# Patient Record
Sex: Female | Born: 1935 | Race: White | Hispanic: No | Marital: Married | Smoking: Never smoker
Health system: Southern US, Community
[De-identification: ages and names within clinical notes are randomized; demographics above are authoritative.]

## PROBLEM LIST (undated history)

## (undated) DIAGNOSIS — R0600 Dyspnea, unspecified: Secondary | ICD-10-CM

## (undated) DIAGNOSIS — Z8739 Personal history of other diseases of the musculoskeletal system and connective tissue: Secondary | ICD-10-CM

## (undated) DIAGNOSIS — D649 Anemia, unspecified: Secondary | ICD-10-CM

## (undated) DIAGNOSIS — D692 Other nonthrombocytopenic purpura: Secondary | ICD-10-CM

## (undated) DIAGNOSIS — I10 Essential (primary) hypertension: Secondary | ICD-10-CM

## (undated) DIAGNOSIS — I471 Supraventricular tachycardia: Secondary | ICD-10-CM

## (undated) DIAGNOSIS — E7800 Pure hypercholesterolemia, unspecified: Secondary | ICD-10-CM

## (undated) DIAGNOSIS — R002 Palpitations: Secondary | ICD-10-CM

## (undated) DIAGNOSIS — R918 Other nonspecific abnormal finding of lung field: Secondary | ICD-10-CM

## (undated) DIAGNOSIS — J984 Other disorders of lung: Secondary | ICD-10-CM

## (undated) DIAGNOSIS — I341 Nonrheumatic mitral (valve) prolapse: Secondary | ICD-10-CM

## (undated) DIAGNOSIS — I493 Ventricular premature depolarization: Secondary | ICD-10-CM

## (undated) DIAGNOSIS — E785 Hyperlipidemia, unspecified: Secondary | ICD-10-CM

## (undated) DIAGNOSIS — R5381 Other malaise: Secondary | ICD-10-CM

## (undated) DIAGNOSIS — R5383 Other fatigue: Secondary | ICD-10-CM

## (undated) DIAGNOSIS — M949 Disorder of cartilage, unspecified: Secondary | ICD-10-CM

## (undated) DIAGNOSIS — M899 Disorder of bone, unspecified: Secondary | ICD-10-CM

## (undated) DIAGNOSIS — R21 Rash and other nonspecific skin eruption: Secondary | ICD-10-CM

## (undated) DIAGNOSIS — Z8679 Personal history of other diseases of the circulatory system: Secondary | ICD-10-CM

## (undated) DIAGNOSIS — IMO0002 Reserved for concepts with insufficient information to code with codable children: Secondary | ICD-10-CM

## (undated) DIAGNOSIS — L408 Other psoriasis: Secondary | ICD-10-CM

## (undated) HISTORY — DX: Dyspnea, unspecified: R06.00

## (undated) HISTORY — DX: Other nonspecific abnormal finding of lung field: R91.8

## (undated) HISTORY — DX: Personal history of other diseases of the circulatory system: Z86.79

## (undated) HISTORY — DX: Other psoriasis: L40.8

## (undated) HISTORY — DX: Disorder of bone, unspecified: M89.9

## (undated) HISTORY — DX: Ventricular premature depolarization: I49.3

## (undated) HISTORY — DX: Disorder of cartilage, unspecified: M94.9

## (undated) HISTORY — DX: Other malaise: R53.81

## (undated) HISTORY — PX: TONSILLECTOMY: SUR1361

## (undated) HISTORY — DX: Essential (primary) hypertension: I10

## (undated) HISTORY — DX: Anemia, unspecified: D64.9

## (undated) HISTORY — DX: Palpitations: R00.2

## (undated) HISTORY — DX: Supraventricular tachycardia: I47.1

## (undated) HISTORY — DX: Personal history of other diseases of the musculoskeletal system and connective tissue: Z87.39

## (undated) HISTORY — PX: BACK SURGERY: SHX140

## (undated) HISTORY — DX: Reserved for concepts with insufficient information to code with codable children: IMO0002

## (undated) HISTORY — DX: Rash and other nonspecific skin eruption: R21

## (undated) HISTORY — DX: Other disorders of lung: J98.4

## (undated) HISTORY — DX: Other nonthrombocytopenic purpura: D69.2

## (undated) HISTORY — DX: Other fatigue: R53.83

## (undated) HISTORY — DX: Hyperlipidemia, unspecified: E78.5

---

## 1997-11-13 ENCOUNTER — Other Ambulatory Visit: Admission: RE | Admit: 1997-11-13 | Discharge: 1997-11-13 | Payer: Self-pay | Admitting: Internal Medicine

## 1998-06-18 ENCOUNTER — Encounter: Payer: Self-pay | Admitting: Emergency Medicine

## 1998-06-18 ENCOUNTER — Emergency Department (HOSPITAL_COMMUNITY): Admission: EM | Admit: 1998-06-18 | Discharge: 1998-06-18 | Payer: Self-pay | Admitting: Emergency Medicine

## 2000-07-27 ENCOUNTER — Inpatient Hospital Stay (HOSPITAL_COMMUNITY): Admission: EM | Admit: 2000-07-27 | Discharge: 2000-07-28 | Payer: Self-pay | Admitting: Emergency Medicine

## 2000-07-27 ENCOUNTER — Encounter: Payer: Self-pay | Admitting: Internal Medicine

## 2000-07-28 ENCOUNTER — Encounter: Payer: Self-pay | Admitting: Internal Medicine

## 2000-10-29 ENCOUNTER — Ambulatory Visit (HOSPITAL_COMMUNITY): Admission: RE | Admit: 2000-10-29 | Discharge: 2000-10-29 | Payer: Self-pay | Admitting: Internal Medicine

## 2000-10-29 ENCOUNTER — Encounter: Payer: Self-pay | Admitting: Internal Medicine

## 2001-01-28 ENCOUNTER — Other Ambulatory Visit: Admission: RE | Admit: 2001-01-28 | Discharge: 2001-01-28 | Payer: Self-pay | Admitting: Obstetrics and Gynecology

## 2001-09-30 ENCOUNTER — Encounter: Payer: Self-pay | Admitting: Internal Medicine

## 2001-09-30 ENCOUNTER — Ambulatory Visit (HOSPITAL_COMMUNITY): Admission: RE | Admit: 2001-09-30 | Discharge: 2001-09-30 | Payer: Self-pay | Admitting: Internal Medicine

## 2002-02-20 ENCOUNTER — Encounter: Payer: Self-pay | Admitting: Internal Medicine

## 2003-10-30 ENCOUNTER — Encounter: Payer: Self-pay | Admitting: Internal Medicine

## 2003-10-30 ENCOUNTER — Encounter: Admission: RE | Admit: 2003-10-30 | Discharge: 2003-10-30 | Payer: Self-pay | Admitting: Internal Medicine

## 2004-07-15 ENCOUNTER — Ambulatory Visit: Payer: Self-pay | Admitting: Internal Medicine

## 2004-10-14 ENCOUNTER — Ambulatory Visit: Payer: Self-pay | Admitting: Internal Medicine

## 2004-10-20 ENCOUNTER — Ambulatory Visit: Payer: Self-pay | Admitting: Internal Medicine

## 2004-12-27 ENCOUNTER — Encounter: Payer: Self-pay | Admitting: Internal Medicine

## 2005-01-23 ENCOUNTER — Ambulatory Visit: Payer: Self-pay | Admitting: Internal Medicine

## 2005-10-26 ENCOUNTER — Ambulatory Visit: Payer: Self-pay | Admitting: Internal Medicine

## 2005-10-27 ENCOUNTER — Ambulatory Visit: Payer: Self-pay | Admitting: Internal Medicine

## 2006-09-04 ENCOUNTER — Ambulatory Visit: Payer: Self-pay | Admitting: Internal Medicine

## 2006-09-04 LAB — CONVERTED CEMR LAB
Bilirubin Urine: NEGATIVE
Specific Gravity, Urine: <1.005
Urobilinogen, UA: NEGATIVE

## 2006-09-07 ENCOUNTER — Encounter (INDEPENDENT_AMBULATORY_CARE_PROVIDER_SITE_OTHER): Payer: Self-pay | Admitting: *Deleted

## 2006-09-17 ENCOUNTER — Encounter: Payer: Self-pay | Admitting: Internal Medicine

## 2006-10-16 ENCOUNTER — Encounter: Payer: Self-pay | Admitting: Internal Medicine

## 2006-10-25 ENCOUNTER — Ambulatory Visit: Payer: Self-pay | Admitting: Internal Medicine

## 2006-10-25 ENCOUNTER — Encounter: Payer: Self-pay | Admitting: Internal Medicine

## 2006-10-25 DIAGNOSIS — I10 Essential (primary) hypertension: Secondary | ICD-10-CM

## 2006-10-25 DIAGNOSIS — L408 Other psoriasis: Secondary | ICD-10-CM | POA: Insufficient documentation

## 2006-10-25 DIAGNOSIS — J984 Other disorders of lung: Secondary | ICD-10-CM

## 2006-10-25 DIAGNOSIS — E785 Hyperlipidemia, unspecified: Secondary | ICD-10-CM

## 2006-10-25 DIAGNOSIS — Z8679 Personal history of other diseases of the circulatory system: Secondary | ICD-10-CM

## 2006-10-25 DIAGNOSIS — Z8739 Personal history of other diseases of the musculoskeletal system and connective tissue: Secondary | ICD-10-CM

## 2006-10-25 DIAGNOSIS — IMO0002 Reserved for concepts with insufficient information to code with codable children: Secondary | ICD-10-CM

## 2006-10-25 HISTORY — DX: Personal history of other diseases of the circulatory system: Z86.79

## 2006-10-25 HISTORY — DX: Reserved for concepts with insufficient information to code with codable children: IMO0002

## 2006-10-25 HISTORY — DX: Other disorders of lung: J98.4

## 2006-10-25 HISTORY — DX: Personal history of other diseases of the musculoskeletal system and connective tissue: Z87.39

## 2006-10-25 HISTORY — DX: Hyperlipidemia, unspecified: E78.5

## 2006-10-25 HISTORY — DX: Other psoriasis: L40.8

## 2006-10-25 HISTORY — DX: Essential (primary) hypertension: I10

## 2006-10-25 LAB — CONVERTED CEMR LAB: Cholesterol, target level: 200 mg/dL

## 2006-10-30 ENCOUNTER — Encounter (INDEPENDENT_AMBULATORY_CARE_PROVIDER_SITE_OTHER): Payer: Self-pay | Admitting: *Deleted

## 2006-10-30 ENCOUNTER — Encounter: Payer: Self-pay | Admitting: Internal Medicine

## 2006-10-30 LAB — CONVERTED CEMR LAB
ALT: 11 units/L (ref 0–35)
AST: 19 units/L (ref 0–37)
BUN: 18 mg/dL (ref 6–23)
Basophils Relative: 1.8 % — ABNORMAL HIGH (ref 0.0–1.0)
Creatinine, Ser: 1.1 mg/dL (ref 0.4–1.2)
HDL: 95.5 mg/dL (ref >39.0)
Hemoglobin: 12.7 g/dL (ref 12.0–15.0)
LDL Cholesterol: 73 mg/dL (ref 0–99)
Monocytes Relative: 7.5 % (ref 3.0–11.0)
Platelets: 208 10*3/uL (ref 150–400)
Potassium: 4 meq/L (ref 3.5–5.1)
RDW: 12.7 % (ref 11.5–14.6)
Triglycerides: 123 mg/dL (ref 0–149)
VLDL: 25 mg/dL (ref 0–40)

## 2006-11-02 ENCOUNTER — Telehealth (INDEPENDENT_AMBULATORY_CARE_PROVIDER_SITE_OTHER): Payer: Self-pay | Admitting: *Deleted

## 2007-01-22 ENCOUNTER — Ambulatory Visit: Payer: Self-pay | Admitting: Internal Medicine

## 2007-01-23 ENCOUNTER — Encounter: Payer: Self-pay | Admitting: Internal Medicine

## 2007-01-28 ENCOUNTER — Telehealth: Payer: Self-pay | Admitting: Internal Medicine

## 2007-01-29 ENCOUNTER — Encounter (INDEPENDENT_AMBULATORY_CARE_PROVIDER_SITE_OTHER): Payer: Self-pay | Admitting: *Deleted

## 2007-10-14 ENCOUNTER — Telehealth (INDEPENDENT_AMBULATORY_CARE_PROVIDER_SITE_OTHER): Payer: Self-pay | Admitting: *Deleted

## 2007-11-06 ENCOUNTER — Ambulatory Visit: Payer: Self-pay | Admitting: Internal Medicine

## 2007-11-06 DIAGNOSIS — R002 Palpitations: Secondary | ICD-10-CM

## 2007-11-06 DIAGNOSIS — M949 Disorder of cartilage, unspecified: Secondary | ICD-10-CM

## 2007-11-06 DIAGNOSIS — M899 Disorder of bone, unspecified: Secondary | ICD-10-CM

## 2007-11-06 DIAGNOSIS — D692 Other nonthrombocytopenic purpura: Secondary | ICD-10-CM

## 2007-11-06 HISTORY — DX: Other nonthrombocytopenic purpura: D69.2

## 2007-11-06 HISTORY — DX: Disorder of bone, unspecified: M89.9

## 2007-11-06 HISTORY — DX: Palpitations: R00.2

## 2007-11-11 ENCOUNTER — Encounter (INDEPENDENT_AMBULATORY_CARE_PROVIDER_SITE_OTHER): Payer: Self-pay | Admitting: *Deleted

## 2007-12-03 ENCOUNTER — Ambulatory Visit: Payer: Self-pay | Admitting: Internal Medicine

## 2007-12-04 ENCOUNTER — Encounter (INDEPENDENT_AMBULATORY_CARE_PROVIDER_SITE_OTHER): Payer: Self-pay | Admitting: *Deleted

## 2008-01-01 ENCOUNTER — Encounter (INDEPENDENT_AMBULATORY_CARE_PROVIDER_SITE_OTHER): Payer: Self-pay | Admitting: *Deleted

## 2008-01-24 ENCOUNTER — Encounter: Payer: Self-pay | Admitting: Internal Medicine

## 2008-01-30 ENCOUNTER — Telehealth (INDEPENDENT_AMBULATORY_CARE_PROVIDER_SITE_OTHER): Payer: Self-pay | Admitting: *Deleted

## 2008-03-17 ENCOUNTER — Telehealth (INDEPENDENT_AMBULATORY_CARE_PROVIDER_SITE_OTHER): Payer: Self-pay | Admitting: *Deleted

## 2008-05-11 ENCOUNTER — Ambulatory Visit: Payer: Self-pay | Admitting: Internal Medicine

## 2008-05-24 LAB — CONVERTED CEMR LAB: BUN: 18 mg/dL (ref 6–23)

## 2008-05-25 ENCOUNTER — Encounter (INDEPENDENT_AMBULATORY_CARE_PROVIDER_SITE_OTHER): Payer: Self-pay | Admitting: *Deleted

## 2008-06-15 ENCOUNTER — Ambulatory Visit: Payer: Self-pay | Admitting: Internal Medicine

## 2008-11-06 ENCOUNTER — Ambulatory Visit: Payer: Self-pay | Admitting: Internal Medicine

## 2008-11-06 DIAGNOSIS — R5381 Other malaise: Secondary | ICD-10-CM

## 2008-11-06 DIAGNOSIS — R5383 Other fatigue: Secondary | ICD-10-CM

## 2008-11-06 HISTORY — DX: Other malaise: R53.81

## 2008-11-16 ENCOUNTER — Encounter: Payer: Self-pay | Admitting: Internal Medicine

## 2008-11-17 ENCOUNTER — Telehealth (INDEPENDENT_AMBULATORY_CARE_PROVIDER_SITE_OTHER): Payer: Self-pay | Admitting: *Deleted

## 2008-12-18 ENCOUNTER — Encounter: Payer: Self-pay | Admitting: Internal Medicine

## 2008-12-30 ENCOUNTER — Encounter: Payer: Self-pay | Admitting: Internal Medicine

## 2009-01-11 ENCOUNTER — Encounter: Payer: Self-pay | Admitting: Internal Medicine

## 2009-06-23 ENCOUNTER — Encounter: Payer: Self-pay | Admitting: Internal Medicine

## 2009-07-08 ENCOUNTER — Encounter: Payer: Self-pay | Admitting: Internal Medicine

## 2009-08-10 ENCOUNTER — Encounter: Payer: Self-pay | Admitting: Internal Medicine

## 2009-11-09 ENCOUNTER — Ambulatory Visit: Payer: Self-pay | Admitting: Internal Medicine

## 2009-11-09 DIAGNOSIS — R21 Rash and other nonspecific skin eruption: Secondary | ICD-10-CM

## 2009-11-09 HISTORY — DX: Rash and other nonspecific skin eruption: R21

## 2009-11-17 ENCOUNTER — Encounter (INDEPENDENT_AMBULATORY_CARE_PROVIDER_SITE_OTHER): Payer: Self-pay | Admitting: *Deleted

## 2009-11-17 ENCOUNTER — Ambulatory Visit: Payer: Self-pay | Admitting: Internal Medicine

## 2009-11-17 LAB — CONVERTED CEMR LAB: OCCULT 1: NEGATIVE

## 2009-11-23 ENCOUNTER — Telehealth (INDEPENDENT_AMBULATORY_CARE_PROVIDER_SITE_OTHER): Payer: Self-pay | Admitting: *Deleted

## 2010-04-10 ENCOUNTER — Encounter: Payer: Self-pay | Admitting: Internal Medicine

## 2010-04-17 LAB — CONVERTED CEMR LAB
ALT: 10 units/L (ref 0–35)
ALT: 10 units/L (ref 0–35)
AST: 23 units/L (ref 0–37)
Albumin: 4.1 g/dL (ref 3.5–5.2)
Albumin: 4.2 g/dL (ref 3.5–5.2)
Alkaline Phosphatase: 57 units/L (ref 39–117)
BUN: 24 mg/dL — ABNORMAL HIGH (ref 6–23)
Basophils Absolute: 0 10*3/uL (ref 0.0–0.1)
Basophils Relative: 0.1 % (ref 0.0–3.0)
Basophils Relative: 0.2 % (ref 0.0–3.0)
Basophils Relative: 0.7 % (ref 0.0–3.0)
Bilirubin, Direct: 0.1 mg/dL (ref 0.0–0.3)
CO2: 30 meq/L (ref 19–32)
Calcium: 9.2 mg/dL (ref 8.4–10.5)
Chloride: 108 meq/L (ref 96–112)
Chloride: 110 meq/L (ref 96–112)
Direct LDL: 80.3 mg/dL
Eosinophils Absolute: 0.2 10*3/uL (ref 0.0–0.7)
Eosinophils Absolute: 0.3 10*3/uL (ref 0.0–0.7)
Eosinophils Relative: 3.1 % (ref 0.0–5.0)
Eosinophils Relative: 5.9 % — ABNORMAL HIGH (ref 0.0–5.0)
HCT: 34.2 % — ABNORMAL LOW (ref 36.0–46.0)
HCT: 38 % (ref 36.0–46.0)
Hemoglobin: 13.1 g/dL (ref 12.0–15.0)
Hemoglobin: 13.7 g/dL (ref 12.0–15.0)
LDL Cholesterol: 58 mg/dL (ref 0–99)
Lymphocytes Relative: 30.3 % (ref 12.0–46.0)
Lymphs Abs: 1.6 10*3/uL (ref 0.7–4.0)
Lymphs Abs: 1.7 10*3/uL (ref 0.7–4.0)
MCHC: 34.2 g/dL (ref 30.0–36.0)
MCHC: 34.2 g/dL (ref 30.0–36.0)
MCV: 96.1 fL (ref 78.0–100.0)
MCV: 98.3 fL (ref 78.0–100.0)
MCV: 98.5 fL (ref 78.0–100.0)
Monocytes Absolute: 0.4 10*3/uL (ref 0.1–1.0)
Monocytes Absolute: 0.4 10*3/uL (ref 0.1–1.0)
Neutro Abs: 2.8 10*3/uL (ref 1.4–7.7)
Neutro Abs: 3.7 10*3/uL (ref 1.4–7.7)
Neutrophils Relative %: 43.5 % (ref 43.0–77.0)
Platelets: 176 10*3/uL (ref 150.0–400.0)
Potassium: 3.5 meq/L (ref 3.5–5.1)
Potassium: 3.7 meq/L (ref 3.5–5.1)
RBC: 3.56 M/uL — ABNORMAL LOW (ref 3.87–5.11)
RBC: 3.86 M/uL — ABNORMAL LOW (ref 3.87–5.11)
RBC: 4.06 M/uL (ref 3.87–5.11)
Sodium: 145 meq/L (ref 135–145)
Sodium: 146 meq/L — ABNORMAL HIGH (ref 135–145)
TSH: 2.73 microintl units/mL (ref 0.35–5.50)
Total Bilirubin: 0.7 mg/dL (ref 0.3–1.2)
Total CHOL/HDL Ratio: 2
Total CK: 52 units/L (ref 7–177)
Total Protein: 6.1 g/dL (ref 6.0–8.3)
Total Protein: 7.1 g/dL (ref 6.0–8.3)
WBC: 5 10*3/uL (ref 4.5–10.5)

## 2010-04-19 NOTE — Letter (Signed)
Summary: [facility]   Imported By: Lanelle Bal 08/26/2009 14:14:35  _____________________________________________________________________  External Attachment:    Type:   Image     Comment:   External Document

## 2010-04-19 NOTE — Progress Notes (Signed)
Summary: SIMVASTATIN --90 DAY REFILL  Phone Note Refill Request Message from:  Fax from Pharmacy  Refills Requested: Medication #1:  ZOCOR 40 MG  TABS 1 by mouth qd [facility], [address] [place], [place]     [place] [phone]  PHONE [phone]  Initial call taken by: Jerolyn Shin,  November 23, 2009 1:04 PM    Prescriptions: ZOCOR 40 MG  TABS (SIMVASTATIN) 1 by mouth qd  #90 x 3   Entered by:   Shonna Chock CMA   Authorized by:   Marga Melnick MD   Signed by:   Shonna Chock CMA on 11/23/2009   Method used:   Electronically to        [facility] (retail)       [address]       [place], [place]  [address]       Ph: [phone] or [phone]       Fax: [phone]   RxID:   [id number]

## 2010-04-19 NOTE — Letter (Signed)
Summary: Results Follow up Letter  [facility] [place]  [address] [place], [place] [address]   Phone: [phone]  Fax: [phone]    11/17/2009 MRN: [id number]  Cheyenne Robinson [address] [place], [place]  [address]  Dear Ms. Grabill,  The following are the results of your recent test(s):  Test         Result    Pap Smear:        Normal _____  Not Normal _____ Comments: ______________________________________________________ Cholesterol: LDL(Bad cholesterol):         Your goal is less than:         HDL (Good cholesterol):       Your goal is more than: Comments:  ______________________________________________________ Mammogram:        Normal _____  Not Normal _____ Comments:  ___________________________________________________________________ Hemoccult:        Normal __X___  Not normal _______ Comments:    _____________________________________________________________________ Other Tests:    We routinely do not discuss normal results over the telephone.  If you desire a copy of the results, or you have any questions about this information we can discuss them at your next office visit.   Sincerely,

## 2010-04-19 NOTE — Progress Notes (Signed)
Summary: hop-refill  Phone Note Refill Request   Refills Requested: Medication #1:  simvastatin 40 mg [facility]--[phone]  Initial call taken by: Freddy Jaksch,  January 30, 2008 10:27 AM      Prescriptions: ZOCOR 40 MG  TABS (SIMVASTATIN) 1 by mouth qd  #90 x 1   Entered by:   Ardyth Man   Authorized by:   Marga Melnick MD   Signed by:   Ardyth Man on 01/30/2008   Method used:   Electronically to        [facility] (retail)       [address]       [place], [place]  [address]       Ph: [phone] or [phone]       Fax: [phone]   RxID:   [id number]
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
## 2010-08-05 NOTE — Discharge Summary (Signed)
[place]  Patient:    Cheyenne Robinson, Cheyenne Robinson                     MRN: [id number] Adm. Date:  [id number] Disc. Date: [id number] Attending:  Dolores Patty                           Discharge Summary  ADMISSION DIAGNOSES: 1. Chest pain in the setting of mitral valve prolapse. 2. Hypertension, suboptimally controlled. 3. Exertional dyspnea. 4. ______ therapy for osteoporosis. 5. Hyperlipidemia for which she is taking Nystatin and Lescol.  DISCHARGE DIAGNOSES: 1. Chest pain, resolved. 2. Cardiac enzymes negative with negative spiral CT and venous Doppler.  BRIEF HISTORY:  [name] is an extremely nice, 77 year old white female with no mitral valve prolapse.  She has had two to three weeks of intermittent chest pain.  Please see a copy of the history and physical on May 10, for full details.  HOSPITAL COURSE:  She was monitored on telemetry and cardiac enzymes were collected.  EKG revealed nonspecific ST T wave abnormality; digoxin effect was suggested, although, she was not on digitalis.  Venous Doppler was negative for deep venous thrombosis.  Portable chest x-ray showed no active disease. Spiral CT revealed no evidence of pulmonary embolus.  There was a nonspecific 1.2 x 0.9 cm right lower lobe nodule.  She also had some mild atelectasis and scarring bibasilarly with cardiac enlargement.  A lipid profile was performed which revealed a cholesterol of 223 with triglycerides of 98, HDL 104, and LDL of 99.  Her TSH was therapeutic and free T4 and free T3 were also normal.  She was asymptomatic on May 11.  Because of the negative study, she was discharged.  She was discharged on her Cardizem 300 mg daily, but hydrochlorothiazide 25 mg 1/2 daily was added because of the suboptimally controlled blood pressure.  Additionally, she was discharged on Protonix as hiatal hernia reflux was a possible explanation for her chest pain.  Obviously, her  mitral valve prolapse may be an etiologic component as well, although, she denies palpitations at this time.  She will be reevaluated in three to four weeks.  GI evaluation may be necessary if her symptoms persist. DD:  08/18/00 TD:  08/19/00 Job: [id number]

## 2011-10-26 ENCOUNTER — Encounter: Payer: Self-pay | Admitting: Cardiovascular Disease

## 2011-12-18 ENCOUNTER — Other Ambulatory Visit: Payer: Self-pay

## 2011-12-18 ENCOUNTER — Encounter (HOSPITAL_COMMUNITY): Payer: Self-pay | Admitting: Family Medicine

## 2011-12-18 ENCOUNTER — Emergency Department (HOSPITAL_COMMUNITY)
Admission: EM | Admit: 2011-12-18 | Discharge: 2011-12-18 | Disposition: A | Discharge status: Home / Self Care | Payer: Medicare Other | Attending: Emergency Medicine | Admitting: Emergency Medicine

## 2011-12-18 DIAGNOSIS — I059 Rheumatic mitral valve disease, unspecified: Secondary | ICD-10-CM | POA: Insufficient documentation

## 2011-12-18 DIAGNOSIS — R002 Palpitations: Secondary | ICD-10-CM

## 2011-12-18 DIAGNOSIS — E7800 Pure hypercholesterolemia, unspecified: Secondary | ICD-10-CM | POA: Insufficient documentation

## 2011-12-18 HISTORY — DX: Pure hypercholesterolemia, unspecified: E78.00

## 2011-12-18 HISTORY — DX: Nonrheumatic mitral (valve) prolapse: I34.1

## 2011-12-18 LAB — COMPREHENSIVE METABOLIC PANEL
ALT: 15 U/L (ref 0–35)
AST: 23 U/L (ref 0–37)
Albumin: 3.8 g/dL (ref 3.5–5.2)
Alkaline Phosphatase: 64 U/L (ref 39–117)
GFR calc Af Amer: 60 mL/min — ABNORMAL LOW (ref >90)
Glucose, Bld: 108 mg/dL — ABNORMAL HIGH (ref 70–99)
Potassium: 3.8 mEq/L (ref 3.5–5.1)
Sodium: 143 mEq/L (ref 135–145)
Total Protein: 6.5 g/dL (ref 6.0–8.3)

## 2011-12-18 LAB — CBC WITH DIFFERENTIAL/PLATELET
Eosinophils Absolute: 0 10*3/uL (ref 0.0–0.7)
Lymphs Abs: 1.5 10*3/uL (ref 0.7–4.0)
MCH: 32.7 pg (ref 26.0–34.0)
Neutrophils Relative %: 58 % (ref 43–77)
Platelets: 157 10*3/uL (ref 150–400)
RBC: 3.94 MIL/uL (ref 3.87–5.11)
WBC: 4.7 10*3/uL (ref 4.0–10.5)

## 2011-12-18 MED ORDER — METOPROLOL SUCCINATE ER 25 MG PO TB24: 25.0000 mg | ORAL_TABLET | Freq: Every day | ORAL | Status: DC

## 2011-12-18 NOTE — ED Provider Notes (Signed)
History     CSN: [id number]  Arrival date & time 12/18/11  1478   First MD Initiated Contact with Patient 12/18/11 0900      Chief Complaint  Patient presents with  . Tachycardia    (Consider location/radiation/quality/duration/timing/severity/associated sxs/prior treatment) HPI Comments: Patient reports having recurrent episodes of palpitations for the past several months.  She has been seen by her pcp for the same and is due to see Dr. Sanjuana Kava on the 10th of October.  She has had a Holter monitor done but she is unsure of the results.  She also had an echocardiogram for which she has the report.  This shows an EF of 55% with mitral regurgitation.    She denies any chest pain but does feel short of breath.  She woke last night with an episode of palpitations that was worse than most others.  Feels tired today.  Patient is a 78 y.o. female presenting with palpitations. The history is provided by the patient.  Palpitations  This is a recurrent problem. Episode onset: last night. The problem occurs daily. The problem has been gradually worsening. Associated with: nothing  Associated symptoms include irregular heartbeat and shortness of breath. Pertinent negatives include no diaphoresis, no fever and no chest pain.    Past Medical History  Diagnosis Date  . Mitral valve prolapse   . High cholesterol     History reviewed. No pertinent past surgical history.  History reviewed. No pertinent family history.  History  Substance Use Topics  . Smoking status: Never Smoker   . Smokeless tobacco: Not on file  . Alcohol Use: No    OB History    Grav Para Term Preterm Abortions TAB SAB Ect Mult Living                  Review of Systems  Constitutional: Negative for fever and diaphoresis.  Respiratory: Positive for shortness of breath.   Cardiovascular: Positive for palpitations. Negative for chest pain and leg swelling.  All other systems reviewed and are  negative.    Allergies  Moxifloxacin and Sulfonamide derivatives  Home Medications   Current Outpatient Rx  Name Route Sig Dispense Refill  . ASPIRIN 81 MG PO TABS Oral Take 81 mg by mouth daily.    Marland Kitchen CALCIUM 1200 PO Oral Take 1 tablet by mouth daily.    Marland Kitchen VITAMIN D 1000 UNITS PO TABS Oral Take 1,000 Units by mouth daily.    Marland Kitchen DILTIAZEM HCL ER COATED BEADS 120 MG PO CP24 Oral Take 120 mg by mouth daily.    Marland Kitchen LOVASTATIN 40 MG PO TABS Oral Take 40 mg by mouth at bedtime.    Marland Kitchen VITAMIN B-12 1000 MCG PO TABS Oral Take 1,000 mcg by mouth daily.      BP 162/93  Pulse 84  Temp 97.4 F (36.3 C)  Resp 18  SpO2 97%  Physical Exam  Nursing note and vitals reviewed. Constitutional: She is oriented to person, place, and time. She appears well-developed and well-nourished. No distress.  HENT:  Head: Normocephalic and atraumatic.  Neck: Normal range of motion. Neck supple.  Cardiovascular: Normal rate and regular rhythm.  Exam reveals no gallop and no friction rub.   No murmur heard. Pulmonary/Chest: Effort normal and breath sounds normal. No respiratory distress. She has no wheezes.  Abdominal: Soft. Bowel sounds are normal. She exhibits no distension. There is no tenderness.  Musculoskeletal: Normal range of motion.  Neurological: She is alert and oriented  to person, place, and time.  Skin: Skin is warm and dry. She is not diaphoretic.    ED Course  Procedures (including critical care time)   Labs Reviewed  CBC WITH DIFFERENTIAL  COMPREHENSIVE METABOLIC PANEL  TROPONIN I  TSH   No results found.   No diagnosis found.   Date: 12/18/2011  Rate: 81  Rhythm: normal sinus rhythm  QRS Axis: normal  Intervals: normal  ST/T Wave abnormalities: normal  Conduction Disutrbances:none  Narrative Interpretation:   Old EKG Reviewed: unchanged     MDM  The patient presents with palpitations for the past several months.  Her recent echo showed mr, but normal ef.  A recent  Holter showed frequent and runs of PAC's.  The labs look unremarkable today with a tsh pending.  I have spoken with Dr. Tenny Craw who is on call today for [facility].  She believes that adding a low dose of metoprolol XL to her medications would be appropriate.  I will prescribe this.  She has an appointment with Dr. Sanjuana Kava on the 10th of October.  To return prn if she worsens.        Geoffery Lyons, MD 12/18/11 1037

## 2011-12-18 NOTE — ED Notes (Signed)
Per pt she has been having intermittent episodes of rapid heart beat since last night associated with SOB and dizziness. Denies it now. sts she is weak.

## 2011-12-24 ENCOUNTER — Emergency Department (HOSPITAL_COMMUNITY): Payer: Medicare Other

## 2011-12-24 ENCOUNTER — Encounter (HOSPITAL_COMMUNITY): Payer: Self-pay | Admitting: Emergency Medicine

## 2011-12-24 ENCOUNTER — Emergency Department (HOSPITAL_COMMUNITY)
Admission: EM | Admit: 2011-12-24 | Discharge: 2011-12-24 | Disposition: A | Discharge status: Home / Self Care | Payer: Medicare Other | Attending: Emergency Medicine | Admitting: Emergency Medicine

## 2011-12-24 DIAGNOSIS — E789 Disorder of lipoprotein metabolism, unspecified: Secondary | ICD-10-CM | POA: Insufficient documentation

## 2011-12-24 DIAGNOSIS — Z7982 Long term (current) use of aspirin: Secondary | ICD-10-CM | POA: Insufficient documentation

## 2011-12-24 DIAGNOSIS — R0789 Other chest pain: Secondary | ICD-10-CM | POA: Insufficient documentation

## 2011-12-24 DIAGNOSIS — Z79899 Other long term (current) drug therapy: Secondary | ICD-10-CM | POA: Insufficient documentation

## 2011-12-24 LAB — BASIC METABOLIC PANEL
BUN: 15 mg/dL (ref 6–23)
CO2: 26 mEq/L (ref 19–32)
Calcium: 9.8 mg/dL (ref 8.4–10.5)
Creatinine, Ser: 1.09 mg/dL (ref 0.50–1.10)
Glucose, Bld: 99 mg/dL (ref 70–99)

## 2011-12-24 LAB — CBC
HCT: 36.3 % (ref 36.0–46.0)
Hemoglobin: 12.3 g/dL (ref 12.0–15.0)
MCH: 32.7 pg (ref 26.0–34.0)
MCV: 96.5 fL (ref 78.0–100.0)
RBC: 3.76 MIL/uL — ABNORMAL LOW (ref 3.87–5.11)

## 2011-12-24 NOTE — ED Notes (Addendum)
(  Denies: pain, sob or nausea at this time), describes pain as "comes and goes", alert, NAD, calm, interactive, speaking in clear complete sentences, resps e/u, "ready to go, just want to go home, agreeable to stay", lab and xray results reviewed, discussed with EDP, waiting on EDP eval, pending orders. Family at [facility] x2.

## 2011-12-24 NOTE — ED Notes (Signed)
Pt c/o left sided CP starting this am; pt sts some SOB but denies nausea; pt sts seen here last Monday for same

## 2011-12-25 NOTE — ED Provider Notes (Signed)
History     CSN: [id number]  Arrival date & time 12/24/11  1634   First MD Initiated Contact with Patient 12/24/11 1956      Chief Complaint  Patient presents with  . Chest Pain    (Consider location/radiation/quality/duration/timing/severity/associated sxs/prior treatment) HPI Comments: The patient presents here complaining of sharp pains in the chest that started this morning.  She has had several episodes of this today.  These sharp pains last for only a split second, then go away.  There are no aggravating or alleviating factors.  There is no exertional component.  She was seen one week ago by me for palpitations and was started on a beta blocker.  This has helped this.  She has an appointment with cardiology on the 10th of October.    Patient is a 78 y.o. female presenting with chest pain. The history is provided by the patient.  Chest Pain Episode onset: this morning. Chest pain occurs intermittently. The chest pain is resolved. Associated with: nothing. The severity of the pain is mild. The quality of the pain is described as sharp. The pain does not radiate. Exacerbated by: nothing. Pertinent negatives for primary symptoms include no cough and no nausea. She tried nothing for the symptoms. Risk factors include no known risk factors.     Past Medical History  Diagnosis Date  . Mitral valve prolapse   . High cholesterol     History reviewed. No pertinent past surgical history.  History reviewed. No pertinent family history.  History  Substance Use Topics  . Smoking status: Never Smoker   . Smokeless tobacco: Not on file  . Alcohol Use: No    OB History    Grav Para Term Preterm Abortions TAB SAB Ect Mult Living                  Review of Systems  Respiratory: Negative for cough.   Cardiovascular: Positive for chest pain.  Gastrointestinal: Negative for nausea.  All other systems reviewed and are negative.    Allergies  Moxifloxacin and Sulfonamide  derivatives  Home Medications   Current Outpatient Rx  Name Route Sig Dispense Refill  . ASPIRIN 81 MG PO TABS Oral Take 81 mg by mouth daily.    Marland Kitchen CALCIUM 1200 PO Oral Take 1 tablet by mouth daily.    Marland Kitchen VITAMIN D 1000 UNITS PO TABS Oral Take 1,000 Units by mouth daily.    Marland Kitchen DILTIAZEM HCL ER COATED BEADS 120 MG PO CP24 Oral Take 120 mg by mouth daily.    Marland Kitchen LOVASTATIN 40 MG PO TABS Oral Take 40 mg by mouth at bedtime.    Marland Kitchen METOPROLOL SUCCINATE ER 25 MG PO TB24 Oral Take 1 tablet (25 mg total) by mouth daily. 15 tablet 0  . VITAMIN B-12 1000 MCG PO TABS Oral Take 1,000 mcg by mouth daily.      BP 146/72  Pulse 75  Temp 98 F (36.7 C) (Oral)  Resp 16  SpO2 99%  Physical Exam  Nursing note and vitals reviewed. Constitutional: She is oriented to person, place, and time. She appears well-developed and well-nourished. No distress.  HENT:  Head: Normocephalic and atraumatic.  Neck: Normal range of motion. Neck supple.  Cardiovascular: Normal rate and regular rhythm.  Exam reveals no gallop and no friction rub.   No murmur heard. Pulmonary/Chest: Effort normal and breath sounds normal. No respiratory distress. She has no wheezes.  Abdominal: Soft. Bowel sounds are normal. She exhibits  no distension. There is no tenderness.  Musculoskeletal: Normal range of motion.  Neurological: She is alert and oriented to person, place, and time.  Skin: Skin is warm and dry. She is not diaphoretic.    ED Course  Procedures (including critical care time)  Labs Reviewed  CBC - Abnormal; Notable for the following:    RBC 3.76 (*)     All other components within normal limits  BASIC METABOLIC PANEL - Abnormal; Notable for the following:    GFR calc non Af Amer 48 (*)     GFR calc Af Amer 56 (*)     All other components within normal limits  POCT I-STAT TROPONIN I   Dg Chest 2 View  12/24/2011  *RADIOLOGY REPORT*  Clinical Data: Chest pain.  History of mitral valve prolapse.  CHEST - 2 VIEW   Comparison: Radiographs 12/03/2007.  CT 10/30/2003.  Findings: The heart size and mediastinal contours are stable without definite adenopathy.  There is a new 2.8 cm nodular density in the right upper lobe, best seen on the frontal examination.  The left lung is clear.  There is no pleural effusion.  An upper lumbar compression deformity appears unchanged.  IMPRESSION: New right upper lobe pulmonary nodule concerning for malignancy. Chest CT is recommended for further evaluation.   Original Report Authenticated By: Gerrianne Scale, M.D.      1. Atypical chest pain      Date: 12/25/2011  Rate: 70's  Rhythm: normal sinus rhythm  QRS Axis: normal  Intervals: normal  ST/T Wave abnormalities: normal  Conduction Disutrbances:none  Narrative Interpretation:   Old EKG Reviewed: unchanged    MDM  The patient presents here with intermittent sharp pains in her chest since this morning that have occurred intermittently throughout the day.  These last only seconds and are very atypical for cardiac pain.  The workup is negative for mi.  She seems well and I doubt this is cardiac in nature.  I believe she is stable for discharge as she has follow up with cardiology in three days.  She will return here if her symptoms worsen.       Geoffery Lyons, MD 12/25/11 [phone]

## 2011-12-28 ENCOUNTER — Ambulatory Visit (INDEPENDENT_AMBULATORY_CARE_PROVIDER_SITE_OTHER): Payer: Medicare Other | Admitting: Cardiovascular Disease

## 2011-12-28 ENCOUNTER — Encounter: Payer: Self-pay | Admitting: Cardiovascular Disease

## 2011-12-28 VITALS — BP 122/64 | HR 83 | Ht 66.0 in | Wt 123.0 lb

## 2011-12-28 DIAGNOSIS — I493 Ventricular premature depolarization: Secondary | ICD-10-CM

## 2011-12-28 DIAGNOSIS — J984 Other disorders of lung: Secondary | ICD-10-CM

## 2011-12-28 DIAGNOSIS — I4949 Other premature depolarization: Secondary | ICD-10-CM

## 2011-12-28 DIAGNOSIS — I340 Nonrheumatic mitral (valve) insufficiency: Secondary | ICD-10-CM

## 2011-12-28 DIAGNOSIS — I059 Rheumatic mitral valve disease, unspecified: Secondary | ICD-10-CM

## 2011-12-28 MED ORDER — METOPROLOL SUCCINATE ER 25 MG PO TB24: 25.0000 mg | ORAL_TABLET | Freq: Two times a day (BID) | ORAL | Status: DC

## 2011-12-28 NOTE — Patient Instructions (Signed)
Your physician recommends that you schedule a follow-up appointment in:  3-4 weeks.   Non-Cardiac CT scanning, (CAT scanning), is a noninvasive, special x-ray that produces cross-sectional images of the body using x-rays and a computer. CT scans help physicians diagnose and treat medical conditions. For some CT exams, a contrast material is used to enhance visibility in the area of the body being studied. CT scans provide greater clarity and reveal more details than regular x-ray exams.  Your physician has recommended you make the following change in your medication: Increase Toprol XL to 25 mg by mouth twice daily

## 2011-12-28 NOTE — Progress Notes (Signed)
History of Present Illness: 78 yo female with history of PVCs, mitral valve disease, HTN, anemia, HLD who is here today for a second opinion of her cardiac issues. She is followed by Dr. Tomie China, cardiology,  in [place]. She has had a recent workup for fatigue and weakness including an echocardiogram which is reported to show moderate to severe mitral regurgitation. LVEF 50-55%. The RV and LV are not dilated. No other valvular disease. PA pressure mildly elevated. 48 hour Holter monitor with 20,192 PVCs, 142 PACs and several short runs of atrial tachycardia. Her cardiologist planned conservative therapy for now. There was mention of TEE in the future to better assess the mitral valve and possible referral to [facility] for valve surgery if it worsened. She was seen in the [facility] ED last week with c/o palpitations.TSH was normal. EKG was normal. Chest x-ray in ED showed a pulmonary nodule.   She tells me that she has been having weakness and fatigue for several months. No chest pain. She has noted palpitations daily until she was started on Toprol and now they are less frequent.  Some dizziness at times but no near syncope or syncope.   Primary Care Physician: Gaye Alken, NP  Last Lipid Profile:Lipid Panel     Component Value Date/Time   CHOL 167 11/09/2009 0000   TRIG 70.0 11/09/2009 0000   HDL 94.90 11/09/2009 0000   CHOLHDL 2 11/09/2009 0000   VLDL 14.0 11/09/2009 0000   LDLCALC 58 11/09/2009 0000     Past Medical History  Diagnosis Date  . Mitral valve prolapse   . High cholesterol   . HTN (hypertension)   . Anemia     Past Surgical History  Procedure Date  . Tonsillectomy     Current Outpatient Prescriptions  Medication Sig Dispense Refill  . aspirin 81 MG tablet Take 81 mg by mouth daily.      . Calcium Carbonate-Vit D-Min (CALCIUM 1200 PO) Take 1 tablet by mouth daily.      . cholecalciferol (VITAMIN D) 1000 UNITS tablet Take 1,000 Units by mouth daily.      Marland Kitchen diltiazem (CARDIZEM CD)  120 MG 24 hr capsule Take 120 mg by mouth daily.      Marland Kitchen lovastatin (MEVACOR) 40 MG tablet Take 40 mg by mouth at bedtime.      . metoprolol succinate (TOPROL-XL) 25 MG 24 hr tablet Take 1 tablet (25 mg total) by mouth daily.  15 tablet  0  . vitamin B-12 (CYANOCOBALAMIN) 1000 MCG tablet Take 1,000 mcg by mouth daily.        Allergies  Allergen Reactions  . Moxifloxacin     REACTION: mental status changes  . Sulfonamide Derivatives Other (See Comments)    unknown    History   Social History  . Marital Status: Married    Spouse Name: N/A    Number of Children: 2  . Years of Education: N/A   Occupational History  . 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 Smoking status: Never Smoker   . Smokeless tobacco: Not on file  . Alcohol Use: No  . Drug Use: No  . Sexually Active: Not on file   Other Topics Concern  . Not on file   Social History Narrative  . No narrative on file    Family History  Problem Relation Age of Onset  . Alzheimer's disease Father   . Heart disease Mother     Death certificate said "  hardening of the arteries"    Review of Systems:  As stated in the HPI and otherwise negative.   BP 122/64  Pulse 83  Ht 5\' 6"  (1.676 m)  Wt 123 lb (55.792 kg)  BMI 19.85 kg/m2  Physical Examination: General: Well developed, well nourished, NAD HEENT: OP clear, mucus membranes moist SKIN: warm, dry. No rashes. Neuro: No focal deficits Musculoskeletal: Muscle strength 5/5 all ext Psychiatric: Mood and affect normal Neck: No JVD, no carotid bruits, no thyromegaly, no lymphadenopathy. Lungs:Clear bilaterally, no wheezes, rhonci, crackles Cardiovascular: Regular rate and rhythm. Soft systolic murmur noted. No gallops or rubs. Abdomen:Soft. Bowel sounds present. Non-tender.  Extremities: No lower extremity edema. Pulses are 2 + in the bilateral DP/PT.  EKG: NSR, PVC.   Assessment and Plan:   1. Mitral Regurgitation: Reported to be moderate  to severe by outside echo.  I will review her outside echo films which she has brought with her today. PA pressures were not significantly elevated. Will repeat echo in 3 months to assess her valve here in our office. She may need a TEE for further evaluation, pending our findings on f/u surface echo.   2. PVCs: Symptomatic improvement on Toprol. Her LV function is normal. Will continue Cardizem and increase Toprol XL to 25 mg po BID. Will plan on repeating 48 hour Holter monitor when she returns to assess number of PVCs on Toprol therapy. She is not excited to wear another monitor.  3. Lung nodule: on CT scan in ED 12/18/11. Will arrange CT chest to evaluate and will send results to primary care.

## 2012-01-01 ENCOUNTER — Other Ambulatory Visit: Payer: Self-pay | Admitting: *Deleted

## 2012-01-01 ENCOUNTER — Ambulatory Visit (INDEPENDENT_AMBULATORY_CARE_PROVIDER_SITE_OTHER)
Admission: RE | Admit: 2012-01-01 | Discharge: 2012-01-01 | Disposition: A | Discharge status: Home / Self Care | Payer: Medicare Other | Source: Ambulatory Visit | Attending: Cardiovascular Disease | Admitting: Cardiovascular Disease

## 2012-01-01 DIAGNOSIS — J984 Other disorders of lung: Secondary | ICD-10-CM

## 2012-01-01 MED ORDER — IOHEXOL 300 MG/ML  SOLN
80.0000 mL | Freq: Once | INTRAMUSCULAR | Status: AC | PRN
  Administered 2012-01-01: 80 mL via INTRAVENOUS

## 2012-01-04 ENCOUNTER — Encounter: Payer: Self-pay | Admitting: Internal Medicine

## 2012-01-04 ENCOUNTER — Ambulatory Visit (INDEPENDENT_AMBULATORY_CARE_PROVIDER_SITE_OTHER): Payer: Medicare Other | Admitting: Internal Medicine

## 2012-01-04 VITALS — BP 140/88 | HR 69 | Temp 97.7°F | Ht 66.0 in | Wt 122.4 lb

## 2012-01-04 DIAGNOSIS — R0600 Dyspnea, unspecified: Secondary | ICD-10-CM | POA: Insufficient documentation

## 2012-01-04 DIAGNOSIS — R0989 Other specified symptoms and signs involving the circulatory and respiratory systems: Secondary | ICD-10-CM

## 2012-01-04 DIAGNOSIS — R222 Localized swelling, mass and lump, trunk: Secondary | ICD-10-CM

## 2012-01-04 DIAGNOSIS — R918 Other nonspecific abnormal finding of lung field: Secondary | ICD-10-CM

## 2012-01-04 HISTORY — DX: Dyspnea, unspecified: R06.00

## 2012-01-04 HISTORY — DX: Other nonspecific abnormal finding of lung field: R91.8

## 2012-01-04 NOTE — Progress Notes (Addendum)
Subjective:    Patient ID: Cheyenne Robinson, female    DOB: 02/10/1934, 78 y.o.   MRN: [id number]  HPI  PCP is MOON,AMY, NP   Body mass index is 19.76 kg/(m^2).  reports that she has never smoked. She does not have any smokeless tobacco history on file.   IOV 01/04/2012  78 year old female with mitral valve prolapse since 1970s. Felt she was being symptomatic. Saw DR Clifton James and did  CXR 12/24/11  For chest pain  And palpitations. This  showed new RML lat segment nodule compared to cxr 2009. Then had Ct 01/01/12 that showed mass (see below) and referred here. Of note, chest pain and palpitations are improved after Dr Clifton James changed medications  Reports only pulmpnary symptoms as dyspnea. Insidious onset. Few years. STable. Mild dyspnea.  Episodic. Happens at rest as well. Describes it as need to take a breath a few times and then fine. Denies exertional dysopnea, wheezing, cough, orthopnea, pnd, sinus drainage or sputum. Denies prior medical hx of asthma, chronic cough.  She is a never smoker. No passive smoking history either. No hx of cancer. No hx of pneumonia other than 4 years ago (rx as opd, reportedly diagnosed pn CXR in [place], [place]) but had UTI 6 months ago. Not interested in workup  CT chest 01/01/12 Comparison: Chest radiograph 2009 CXR,  12/24/2011 and CT chest 10/30/2003.  Findings: No pathologically enlarged mediastinal, hilar or axillary  lymph nodes. Air is seen in the right ventricular outflow tract  related to injection. Heart is mildly enlarged. No pericardial  effusion. Calcification is seen in the right breast.  Probable post infectious peribronchovascular scarring in the  posterior segment right upper lobe. This area was not imaged on  prior examinations. New V-shaped density in the lateral segment  right middle lobe, at the site of previously seen nonspecific  streaky densities on 10/30/2003. Mild bronchiectasis is seen in  the same location on the prior study.  Scarring in the lingula. No  pleural fluid. Airway is unremarkable.  Incidental imaging of the upper abdomen shows no acute findings.  No worrisome lytic or sclerotic lesions.  IMPRESSION:  V-shaped lesion in the right middle lobe, with a finger-in-glove  configuration. Differential diagnosis includes allergic  bronchopulmonary aspergillosis (ABPA) as well as malignancy.  Consultation with the [facility]  [phone]) should be considered.  Original Report Authenticated By: Reyes Ivan, M.D.       01/18/2012 : discussed at [facility] - there is a 2003 CT chest where in RML where V shaped lesion is currently, there was no V shaped lesion then but some vague nodularity/scarring, Consensus is to give abx and follow imaging    Past Medical History  Diagnosis Date  . Mitral valve prolapse   . High cholesterol   . HTN (hypertension)   . Anemia   . PVC (premature ventricular contraction)      Family History  Problem Relation Age of Onset  . Alzheimer's disease Father   . Heart disease Mother     Death certificate said "hardening of the arteries"     History   Social History  . Marital Status: Married    Spouse Name: N/A    Number of Children: 2  . Years of Education: N/A   Occupational History  . 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 Smoking status: Never Smoker   . Smokeless tobacco: Not on file  . Alcohol Use: No  .  Drug Use: No  . Sexually Active: Not on file   Other Topics Concern  . Not on file   Social History Narrative  . No narrative on file     Allergies  Allergen Reactions  . Moxifloxacin     REACTION: mental status changes  . Sulfonamide Derivatives Other (See Comments)    unknown     Outpatient Prescriptions Prior to Visit  Medication Sig Dispense Refill  . aspirin 81 MG tablet Take 81 mg by mouth daily.      . Calcium Carbonate-Vit D-Min (CALCIUM 1200 PO) Take 1 tablet by mouth daily.       . cholecalciferol (VITAMIN D) 1000 UNITS tablet Take 1,000 Units by mouth daily.      Marland Kitchen diltiazem (CARDIZEM CD) 120 MG 24 hr capsule Take 120 mg by mouth daily.      Marland Kitchen lovastatin (MEVACOR) 40 MG tablet Take 40 mg by mouth at bedtime.      . metoprolol succinate (TOPROL-XL) 25 MG 24 hr tablet Take 1 tablet (25 mg total) by mouth 2 (two) times daily.  180 tablet  2  . vitamin B-12 (CYANOCOBALAMIN) 1000 MCG tablet Take 1,000 mcg by mouth daily.           Review of Systems  Constitutional: Negative for fever and unexpected weight change.  HENT: Negative for ear pain, nosebleeds, congestion, sore throat, rhinorrhea, sneezing, trouble swallowing, dental problem, postnasal drip and sinus pressure.   Eyes: Negative for redness and itching.  Respiratory: Positive for shortness of breath. Negative for cough, chest tightness and wheezing.   Cardiovascular: Positive for palpitations. Negative for leg swelling.  Gastrointestinal: Negative for nausea and vomiting.  Genitourinary: Negative for dysuria.  Musculoskeletal: Negative for joint swelling.  Skin: Negative for rash.  Neurological: Negative for headaches.  Hematological: Does not bruise/bleed easily.  Psychiatric/Behavioral: Negative for dysphoric mood. The patient is not nervous/anxious.        Objective:   Physical Exam  Vitals reviewed. Constitutional: She is oriented to person, place, and time. She appears well-developed and well-nourished. No distress.  HENT:  Head: Normocephalic and atraumatic.  Right Ear: External ear normal.  Left Ear: External ear normal.  Mouth/Throat: Oropharynx is clear and moist. No oropharyngeal exudate.  Eyes: Conjunctivae normal and EOM are normal. Pupils are equal, round, and reactive to light. Right eye exhibits no discharge. Left eye exhibits no discharge. No scleral icterus.  Neck: Normal range of motion. Neck supple. No JVD present. No tracheal deviation present. No thyromegaly present.    Cardiovascular: Normal rate, regular rhythm, normal heart sounds and intact distal pulses.  Exam reveals no gallop and no friction rub.   No murmur heard. Pulmonary/Chest: Effort normal and breath sounds normal. No respiratory distress. She has no wheezes. She has no rales. She exhibits no tenderness.  Abdominal: Soft. Bowel sounds are normal. She exhibits no distension and no mass. There is no tenderness. There is no rebound and no guarding.  Musculoskeletal: Normal range of motion. She exhibits no edema and no tenderness.  Lymphadenopathy:    She has no cervical adenopathy.  Neurological: She is alert and oriented to person, place, and time. She has normal reflexes. No cranial nerve deficit. She exhibits normal muscle tone. Coordination normal.  Skin: Skin is warm and dry. No rash noted. She is not diaphoretic. No erythema. No pallor.  Psychiatric: She has a normal mood and affect. Her behavior is normal. Judgment and thought content normal.  Assessment & Plan:

## 2012-01-04 NOTE — Assessment & Plan Note (Addendum)
PET scan Wil discuss at [facility]-intermed probl for malignancy

## 2012-01-04 NOTE — Assessment & Plan Note (Addendum)
rec PFT but she rfused for now wanting to gon one step at a time and is not seeing dyspnea ass a "problem". Will readdress this later

## 2012-01-04 NOTE — Patient Instructions (Addendum)
Please have PET scan I will call you with results and advise next step

## 2012-01-11 ENCOUNTER — Ambulatory Visit (HOSPITAL_COMMUNITY)
Admission: RE | Admit: 2012-01-11 | Discharge: 2012-01-11 | Disposition: A | Discharge status: Home / Self Care | Payer: Medicare Other | Source: Ambulatory Visit | Attending: Internal Medicine | Admitting: Internal Medicine

## 2012-01-11 DIAGNOSIS — R918 Other nonspecific abnormal finding of lung field: Secondary | ICD-10-CM

## 2012-01-11 DIAGNOSIS — R222 Localized swelling, mass and lump, trunk: Secondary | ICD-10-CM | POA: Insufficient documentation

## 2012-01-11 DIAGNOSIS — R0600 Dyspnea, unspecified: Secondary | ICD-10-CM

## 2012-01-11 LAB — GLUCOSE, CAPILLARY: Glucose-Capillary: 98 mg/dL (ref 70–99)

## 2012-01-11 MED ORDER — FLUDEOXYGLUCOSE F - 18 (FDG) INJECTION
19.2000 | Freq: Once | INTRAVENOUS | Status: AC | PRN
  Administered 2012-01-11: 19.2 via INTRAVENOUS

## 2012-01-17 ENCOUNTER — Telehealth: Payer: Self-pay | Admitting: Internal Medicine

## 2012-01-17 ENCOUNTER — Institutional Professional Consult (permissible substitution): Payer: Medicare Other | Admitting: Internal Medicine

## 2012-01-17 NOTE — Telephone Encounter (Signed)
Called, spoke with pt.  She is requesting PET results form 01/11/12.  MR, pls advise.  Thank you.

## 2012-01-18 MED ORDER — AMOXICILLIN-POT CLAVULANATE 875-125 MG PO TABS: 1.0000 | ORAL_TABLET | Freq: Two times a day (BID) | ORAL | Status: DC

## 2012-01-18 NOTE — Telephone Encounter (Signed)
Spoke to her yesterday and told her will get back after [facility] today  Discussed at [facility]  - consensus is for her to try antibiotics for a week  (I wanted to check autoimmune first but she refused due to inconvenience) -   -  so please call in augmentin 875mg  po bid x 7 days (send to [facility] on [address] in [place]. watch out for side effects sep diarrhea)   - fu with me 3-4 weeks; please give appt with CXR PA and lat on day she seems  -  at followup if lesion still present, then I will do autoimmune workup and allergy labs IgE  For my use   CT scan chest  RML - in 2003 lateral segment there was a nodular scar but now Jan 01, 2012 heart shaped density.  PET scan 01/08/12: week after Jan 01, 2012 ct chest: new inflammatory lesions that are PET hot +

## 2012-01-18 NOTE — Telephone Encounter (Signed)
Set an appt w/ MR for 02/14/12 at 2:00 pm.  Cheyenne Robinson

## 2012-01-18 NOTE — Telephone Encounter (Signed)
Pt is aware that I have sent in her antiboitc to [facility]. She was very appreciative and all of her questions were answered.

## 2012-01-25 ENCOUNTER — Encounter: Payer: Self-pay | Admitting: Cardiovascular Disease

## 2012-01-25 ENCOUNTER — Ambulatory Visit (INDEPENDENT_AMBULATORY_CARE_PROVIDER_SITE_OTHER): Payer: Medicare Other | Admitting: Cardiovascular Disease

## 2012-01-25 VITALS — BP 142/68 | HR 67 | Ht 66.0 in | Wt 121.0 lb

## 2012-01-25 DIAGNOSIS — I4949 Other premature depolarization: Secondary | ICD-10-CM

## 2012-01-25 DIAGNOSIS — I059 Rheumatic mitral valve disease, unspecified: Secondary | ICD-10-CM

## 2012-01-25 DIAGNOSIS — I340 Nonrheumatic mitral (valve) insufficiency: Secondary | ICD-10-CM

## 2012-01-25 DIAGNOSIS — I493 Ventricular premature depolarization: Secondary | ICD-10-CM

## 2012-01-25 NOTE — Progress Notes (Signed)
History of Present Illness: 78 yo female with history of PVCs, mitral valve disease, HTN, anemia, HLD who is here today for cardiac follow up. I saw her as a new patient 12/28/11 for a second opinion of her cardiac issues. She is followed by Dr. Tomie China, cardiology, in [place]. She has had a recent workup for fatigue and weakness including an echocardiogram which is reported to show moderate to severe mitral regurgitation. LVEF 50-55%. The RV and LV are not dilated. No other valvular disease. PA pressure mildly elevated. 48 hour Holter monitor with 20,192 PVCs, 142 PACs and several short runs of atrial tachycardia.  There was mention of TEE in the future to better assess the mitral valve and possible referral to [facility] for valve surgery if it worsened. She was seen in the [facility] ED in October for palpitations.TSH was normal. EKG was normal. Chest x-ray in ED showed a pulmonary nodule. At the first visit, she c/o weakness and fatigue for several months. No chest pain. She noted palpitations daily until she was started on Toprol. I referred her to [facility] for evaluation of her lung nodule. She saw Dr. Marchelle Gearing in pulmonary clinic. PET scan low risk for malignancy. She has been treated with antibiotics for possible infection.   She is here today for cardiac follow up. She feels great. She has no chest pain, SOB. Fatigue is improved. Not aware of any palpitations.    Primary Care Physician: Gaye Alken, NP  Last Lipid Profile:Lipid Panel     Component Value Date/Time   CHOL 167 11/09/2009 0000   TRIG 70.0 11/09/2009 0000   HDL 94.90 11/09/2009 0000   CHOLHDL 2 11/09/2009 0000   VLDL 14.0 11/09/2009 0000   LDLCALC 58 11/09/2009 0000     Past Medical History  Diagnosis Date  . Mitral valve prolapse   . High cholesterol   . HTN (hypertension)   . Anemia   . PVC (premature ventricular contraction)     Past Surgical History  Procedure Date  . Tonsillectomy     Current Outpatient  Prescriptions  Medication Sig Dispense Refill  . aspirin 81 MG tablet Take 81 mg by mouth daily.      . Calcium Carbonate-Vit D-Min (CALCIUM 1200 PO) Take 1 tablet by mouth daily.      . cholecalciferol (VITAMIN D) 1000 UNITS tablet Take 1,000 Units by mouth daily.      Marland Kitchen diltiazem (CARDIZEM CD) 120 MG 24 hr capsule Take 120 mg by mouth daily.      Marland Kitchen lovastatin (MEVACOR) 40 MG tablet Take 40 mg by mouth at bedtime.      . metoprolol succinate (TOPROL-XL) 25 MG 24 hr tablet Take 1 tablet (25 mg total) by mouth 2 (two) times daily.  180 tablet  2  . vitamin B-12 (CYANOCOBALAMIN) 1000 MCG tablet Take 1,000 mcg by mouth daily.        Allergies  Allergen Reactions  . Moxifloxacin     REACTION: mental status changes  . Sulfonamide Derivatives Other (See Comments)    unknown    History   Social History  . Marital Status: Married    Spouse Name: N/A    Number of Children: 2  . Years of Education: N/A   Occupational History  . 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 Smoking status: Never Smoker   . Smokeless tobacco: Not on file  . Alcohol Use: No  . Drug Use: No  . Sexually  Active: Not on file   Other Topics Concern  . Not on file   Social History Narrative  . No narrative on file    Family History  Problem Relation Age of Onset  . Alzheimer's disease Father   . Heart disease Mother     Death certificate said "hardening of the arteries"    Review of Systems:  As stated in the HPI and otherwise negative.   BP 142/68  Pulse 67  Ht 5\' 6"  (1.676 m)  Wt 121 lb (54.885 kg)  BMI 19.53 kg/m2  SpO2 98%  Physical Examination: General: Well developed, well nourished, NAD HEENT: OP clear, mucus membranes moist SKIN: warm, dry. No rashes. Neuro: No focal deficits Musculoskeletal: Muscle strength 5/5 all ext Psychiatric: Mood and affect normal Neck: No JVD, no carotid bruits, no thyromegaly, no lymphadenopathy. Lungs:Clear bilaterally, no wheezes,  rhonci, crackles Cardiovascular: Regular rate and rhythm with ectopy. Systolic murmur. No gallops or rubs. Abdomen:Soft. Bowel sounds present. Non-tender.  Extremities: Trace lower extremity edema. Pulses are 2 + in the bilateral DP/PT.  Assessment and Plan:   1. Mitral Regurgitation: Reported to be moderate to severe by outside echo which is confirmed on review.  Will repeat echo in 2 months to assess her valve here in our office. She may need a TEE for further evaluation, pending our findings on f/u surface echo.   2. PVCs: Symptomatic improvement on Toprol. Her LV function is normal. Will continue Cardizem and Toprol. She is not excited to wear another monitor so we will hold off for now.   3. Lung nodule: She is being followed by Dr. Marchelle Gearing in Pulmonary.

## 2012-01-25 NOTE — Patient Instructions (Addendum)
Your physician wants you to follow-up in:  6 months. You will receive a reminder letter in the mail two months in advance. If you don't receive a letter, please call our office to schedule the follow-up appointment.  Your physician has requested that you have an echocardiogram. Echocardiography is a painless test that uses sound waves to create images of your heart. It provides your doctor with information about the size and shape of your heart and how well your heart's chambers and valves are working. This procedure takes approximately one hour. There are no restrictions for this procedure. To be done in early - mid January 2014

## 2012-02-05 ENCOUNTER — Telehealth: Payer: Self-pay | Admitting: *Deleted

## 2012-02-05 NOTE — Telephone Encounter (Signed)
I called and [id number] to reschedule the pt appt from 02-14-12 to 02-09-12 due to change in doc schedule. Carron Curie, CMA

## 2012-02-05 NOTE — Telephone Encounter (Signed)
appt rescheduled.Carron Curie, CMA

## 2012-02-09 ENCOUNTER — Ambulatory Visit (INDEPENDENT_AMBULATORY_CARE_PROVIDER_SITE_OTHER)
Admission: RE | Admit: 2012-02-09 | Discharge: 2012-02-09 | Disposition: A | Discharge status: Home / Self Care | Payer: Medicare Other | Source: Ambulatory Visit | Attending: Internal Medicine | Admitting: Internal Medicine

## 2012-02-09 ENCOUNTER — Encounter: Payer: Self-pay | Admitting: Internal Medicine

## 2012-02-09 ENCOUNTER — Ambulatory Visit (INDEPENDENT_AMBULATORY_CARE_PROVIDER_SITE_OTHER): Payer: Medicare Other | Admitting: Internal Medicine

## 2012-02-09 ENCOUNTER — Other Ambulatory Visit (INDEPENDENT_AMBULATORY_CARE_PROVIDER_SITE_OTHER): Payer: Medicare Other

## 2012-02-09 VITALS — BP 128/82 | HR 62 | Temp 97.7°F | Ht 66.0 in | Wt 126.0 lb

## 2012-02-09 DIAGNOSIS — R918 Other nonspecific abnormal finding of lung field: Secondary | ICD-10-CM

## 2012-02-09 DIAGNOSIS — R222 Localized swelling, mass and lump, trunk: Secondary | ICD-10-CM

## 2012-02-09 DIAGNOSIS — J8410 Pulmonary fibrosis, unspecified: Secondary | ICD-10-CM

## 2012-02-09 DIAGNOSIS — J849 Interstitial pulmonary disease, unspecified: Secondary | ICD-10-CM

## 2012-02-09 NOTE — Patient Instructions (Addendum)
The right lung nodule/mass still present despite antibiotic treatment  Please do blood work Depending on results next week, you might need lung biopsy

## 2012-02-09 NOTE — Progress Notes (Signed)
Subjective:    Patient ID: Cheyenne Robinson, female    DOB: 11/23/1933, 78 y.o.   MRN: [id number]  HPI PCP is MOON,AMY, NP   Body mass index is 19.76 kg/(m^2).  Non-smoker   IOV 01/04/2012  78 year old female with mitral valve prolapse since 1970s. Felt she was being symptomatic. Saw DR Clifton James and did  CXR 12/24/11  For chest pain  And palpitations. This  showed new RML lat segment nodule compared to cxr 2009. Then had Ct 01/01/12 that showed mass (see below) and referred here. Of note, chest pain and palpitations are improved after Dr Clifton James changed medications  Reports only pulmpnary symptoms as dyspnea. Insidious onset. Few years. STable. Mild dyspnea.  Episodic. Happens at rest as well. Describes it as need to take a breath a few times and then fine. Denies exertional dysopnea, wheezing, cough, orthopnea, pnd, sinus drainage or sputum. Denies prior medical hx of asthma, chronic cough.  She is a never smoker. No passive smoking history either. No hx of cancer. No hx of pneumonia other than 4 years ago (rx as opd, reportedly diagnosed pn CXR in [place], [place]) but had UTI 6 months ago. Not interested in workup  CT chest 01/01/12 Comparison: Chest radiograph 2009 CXR,  12/24/2011 and CT chest 10/30/2003.  Findings: No pathologically enlarged mediastinal, hilar or axillary  lymph nodes. Air is seen in the right ventricular outflow tract  related to injection. Heart is mildly enlarged. No pericardial  effusion. Calcification is seen in the right breast.  Probable post infectious peribronchovascular scarring in the  posterior segment right upper lobe. This area was not imaged on  prior examinations. New V-shaped density in the lateral segment  right middle lobe, at the site of previously seen nonspecific  streaky densities on 10/30/2003. Mild bronchiectasis is seen in  the same location on the prior study. Scarring in the lingula. No  pleural fluid. Airway is unremarkable.  Incidental  imaging of the upper abdomen shows no acute findings.  No worrisome lytic or sclerotic lesions.  IMPRESSION:  V-shaped lesion in the right middle lobe, with a finger-in-glove  configuration. Differential diagnosis includes allergic  bronchopulmonary aspergillosis (ABPA) as well as malignancy.  Consultation with the [facility]  [phone]) should be considered.  Original Report Authenticated By: Reyes Ivan, M.D.       01/18/2012 : discussed at [facility] - there is a 2003 CT chest where in RML where V shaped lesion is currently, there was no V shaped lesion then but some vague nodularity/scarring, Consensus is to give abx and follow imaging   REC Please have PET scan  I will call you with results and advise next step  TELEPHONE CALL 01/17/12 CT scan chest  RML - in 2003 lateral segment there was a nodular scar but now same area  Jan 01, 2012 heart shaped density.  PET scan 01/08/12:  new inflammatory lesions that are PET hot +  Discussed at [facility]  - consensus is for her to try antibiotics for a week  (I wanted to check autoimmune first but she refused due to inconvenience)    -  so please call in augmentin 875mg  po bid x 7 days (send to [facility] on [address] in [place]. watch out for side effects sep diarrhea)   - fu with me 3-4 weeks; please give appt with CXR PA and lat on day she seems  -  at followup if lesion still present, then I  will do autoimmune workup and allergy labs IgE    OV 02/09/2012 Fu after anttibiotics for lung mass(es)  Asymptomatic. Feels augmentin did not impact her positively or negatively. Feels well.  Denies weight loss, dyspnea, cough, sputum, fever, fatigue, chills, nausea, vomit. Arthralgia. However, CXR reviewed below still shows persistence of mass. She is open to having blood work today. SHe still is surprised she has pulmonary problem but is accepting of need for workup   Dg Chest 2 View  02/09/2012   *RADIOLOGY REPORT*  Clinical Data: History of lung mass.  CHEST - 2 VIEW  Comparison: Plain films of the chest 12/24/2011 and PET CT scan 01/11/2012.  Findings: Right middle lobe pulmonary nodule seen on prior studies appears unchanged.  Left lung is clear.  There is cardiomegaly.  No pneumothorax or pleural fluid.  IMPRESSION:  1.  No change right middle lobe pulmonary nodule. 2.  Cardiomegaly without edema.   Original Report Authenticated By: Holley Dexter, M.D.     Review of Systems  Constitutional: Negative for fever and unexpected weight change.  HENT: Negative for ear pain, nosebleeds, congestion, sore throat, rhinorrhea, sneezing, trouble swallowing, dental problem, postnasal drip and sinus pressure.   Eyes: Negative for redness and itching.  Respiratory: Negative for cough, chest tightness, shortness of breath and wheezing.   Cardiovascular: Negative for palpitations and leg swelling.  Gastrointestinal: Negative for nausea and vomiting.  Genitourinary: Negative for dysuria.  Musculoskeletal: Negative for joint swelling.  Skin: Negative for rash.  Neurological: Negative for headaches.  Hematological: Does not bruise/bleed easily.  Psychiatric/Behavioral: Negative for dysphoric mood. The patient is not nervous/anxious.        Objective:   Physical Exam Vitals reviewed. Constitutional: She is oriented to person, place, and time. She appears well-developed and well-nourished. No distress.  HENT:  Head: Normocephalic and atraumatic.  Right Ear: External ear normal.  Left Ear: External ear normal.  Mouth/Throat: Oropharynx is clear and moist. No oropharyngeal exudate.  Eyes: Conjunctivae normal and EOM are normal. Pupils are equal, round, and reactive to light. Right eye exhibits no discharge. Left eye exhibits no discharge. No scleral icterus.  Neck: Normal range of motion. Neck supple. No JVD present. No tracheal deviation present. No thyromegaly present.  Cardiovascular: Normal  rate, regular rhythm, normal heart sounds and intact distal pulses.  Exam reveals no gallop and no friction rub.   No murmur heard. Pulmonary/Chest: Effort normal and breath sounds normal. No respiratory distress. She has no wheezes. She has no rales. She exhibits no tenderness.  Abdominal: Soft. Bowel sounds are normal. She exhibits no distension and no mass. There is no tenderness. There is no rebound and no guarding.  Musculoskeletal: Normal range of motion. She exhibits no edema and no tenderness.  Lymphadenopathy:    She has no cervical adenopathy.  Neurological: She is alert and oriented to person, place, and time. She has normal reflexes. No cranial nerve deficit. She exhibits normal muscle tone. Coordination normal.  Skin: Skin is warm and dry. No rash noted. She is not diaphoretic. No erythema. No pallor.  Psychiatric: She has a normal mood and affect. Her behavior is normal. Judgment and thought content normal.           Assessment & Plan:

## 2012-02-10 LAB — ANGIOTENSIN CONVERTING ENZYME: Angiotensin-Converting Enzyme: 43 U/L (ref 8–52)

## 2012-02-10 LAB — RHEUMATOID FACTOR: Rhuematoid fact SerPl-aCnc: <10 IU/mL (ref <=14)

## 2012-02-12 LAB — ANCA SCREEN W REFLEX TITER: c-ANCA Screen: NEGATIVE

## 2012-02-12 LAB — CYCLIC CITRUL PEPTIDE ANTIBODY, IGG: Cyclic Citrullin Peptide Ab: <2 U/mL (ref 0.0–5.0)

## 2012-02-14 ENCOUNTER — Ambulatory Visit: Payer: Medicare Other | Admitting: Internal Medicine

## 2012-02-18 NOTE — Assessment & Plan Note (Signed)
The RML mass is persisent despite antibiotic Rx. DDx is focal boop, autoimmune disease, BAC etc., She has consented for autoimmune workup. IF this is negative then ENB v CT guided TTNA. SHe is agreeable to plan

## 2012-02-28 ENCOUNTER — Telehealth: Payer: Self-pay | Admitting: Internal Medicine

## 2012-02-28 DIAGNOSIS — R918 Other nonspecific abnormal finding of lung field: Secondary | ICD-10-CM

## 2012-02-28 NOTE — Telephone Encounter (Signed)
Please advise on pt's results.  Pt is aware that MR is out of office and it may be tomorrow before call back.

## 2012-02-29 NOTE — Telephone Encounter (Signed)
Spoke to patient. Explained need for ENB bronch under GA. Explained procedure. She agrees. Wants test after Mar 20, 2012  Thayer Ohm,   Want to know from you if okay for her to undergo GA and bronch procedure with severe MR ?  Also, do you think we should wait for you to complete TEE (mid jan 2014) before we do bronch ?  Thanks  MR

## 2012-03-05 NOTE — Telephone Encounter (Signed)
Murali, Sorry I am just responding. I have been on vacation for the past five days and I am getting back into [facility] today. I think it should be ok for her to have general anesthesia with her moderate MR. I don't think she needs a TEE before her procedure.   Thayer Ohm

## 2012-03-21 NOTE — Telephone Encounter (Signed)
Dr. Marchelle Gearing, do we need to do anything further with this message? Carron Curie, CMA

## 2012-03-24 NOTE — Telephone Encounter (Signed)
Please tell patient  A) Dr Clifton James ok for her to undergo procedure Bronch before any of his testings.   B) DR Byrum says he can try to biopsy it through bronch in the OR (ENB procedure)  Patient wanted to wait till after the holidays. Now it is after holidays so please call her and inform above.   Also, for the biopsy itself and becaue of fact last cxr was 02/09/12 and PET scan was end October 2013 she needs to undergo CT chest without contrast ENB protocol (ordered) now before biopsy planning

## 2012-03-25 ENCOUNTER — Telehealth: Payer: Self-pay | Admitting: Internal Medicine

## 2012-03-25 DIAGNOSIS — R918 Other nonspecific abnormal finding of lung field: Secondary | ICD-10-CM

## 2012-03-25 NOTE — Telephone Encounter (Signed)
Per last phone note: RAMASWAMY,MURALI, MD 03/24/2012 11:15 PM Signed  Please tell patient  A) Dr Clifton James ok for her to undergo procedure Bronch before any of his testings.  B) DR Byrum says he can try to biopsy it through bronch in the OR (ENB procedure)  Patient wanted to wait till after the holidays. Now it is after holidays so please call her and inform above.  Also, for the biopsy itself and becaue of fact last cxr was 02/09/12 and PET scan was end October 2013 she needs to undergo CT chest without contrast ENB protocol (ordered) now before biopsy planning  Pt states she does not want the biopsy that she prefers to be followed by CT scan only. She requests to speak to MR about this. I advised he is out of office until tomorrow and that I will forward message to him. Pt states understanding. Carron Curie, CMA

## 2012-03-25 NOTE — Telephone Encounter (Signed)
See phone note from 03-26-11. Carron Curie, CMA

## 2012-03-26 ENCOUNTER — Other Ambulatory Visit: Payer: Medicare Other

## 2012-03-26 ENCOUNTER — Encounter: Payer: Self-pay | Admitting: *Deleted

## 2012-03-26 ENCOUNTER — Telehealth: Payer: Self-pay | Admitting: Internal Medicine

## 2012-03-26 NOTE — Telephone Encounter (Signed)
Letter completed and faxed to number given. Almyra Free has copy of the letter as well with all other appeal papers.Carron Curie, CMA

## 2012-03-26 NOTE — Telephone Encounter (Signed)
D/w insurance company Dr Marice Potter of [employer]  He agrees patient needs followup ct chest  fo lung mass and noduleesp wuth patient refusal for bx  He says fax  Appeal to [phone] to  (a written fax appeal is needed because is [place] medicare) and to use same case number    In addition he will make recommendation to approve followup CT   Dr. Kalman Shan, M.D., F.C.C.P Pulmonary and Critical Care Medicine Staff Physician [facility] Pager: [phone], If no answer or between  15:00h - 7:00h: call [phone]  03/26/2012 2:58 PM

## 2012-03-26 NOTE — Telephone Encounter (Signed)
Left voiuce mail to call back but also stated ok not to have biopsy but should have CT scan (super d protocol) as followup but change time frame to 3rd week Jan 2014. IF she calls back, I Can talk to her

## 2012-03-26 NOTE — Telephone Encounter (Signed)
I spoke with the pt and she is ok to proceed with the CT scan, so order placed again for this. Nothing further needed at this time. Carron Curie, CMA

## 2012-03-28 ENCOUNTER — Other Ambulatory Visit: Payer: Medicare Other

## 2012-04-01 ENCOUNTER — Ambulatory Visit (HOSPITAL_COMMUNITY): Payer: Medicare Other | Attending: Cardiovascular Disease | Admitting: Radiology

## 2012-04-01 DIAGNOSIS — I319 Disease of pericardium, unspecified: Secondary | ICD-10-CM | POA: Insufficient documentation

## 2012-04-01 DIAGNOSIS — I059 Rheumatic mitral valve disease, unspecified: Secondary | ICD-10-CM

## 2012-04-01 DIAGNOSIS — R002 Palpitations: Secondary | ICD-10-CM | POA: Insufficient documentation

## 2012-04-01 DIAGNOSIS — I379 Nonrheumatic pulmonary valve disorder, unspecified: Secondary | ICD-10-CM | POA: Insufficient documentation

## 2012-04-01 DIAGNOSIS — I10 Essential (primary) hypertension: Secondary | ICD-10-CM | POA: Insufficient documentation

## 2012-04-01 DIAGNOSIS — I369 Nonrheumatic tricuspid valve disorder, unspecified: Secondary | ICD-10-CM | POA: Insufficient documentation

## 2012-04-01 DIAGNOSIS — I340 Nonrheumatic mitral (valve) insufficiency: Secondary | ICD-10-CM

## 2012-04-01 DIAGNOSIS — I493 Ventricular premature depolarization: Secondary | ICD-10-CM

## 2012-04-01 NOTE — Progress Notes (Signed)
Echocardiogram performed.  

## 2012-04-11 ENCOUNTER — Ambulatory Visit (INDEPENDENT_AMBULATORY_CARE_PROVIDER_SITE_OTHER)
Admission: RE | Admit: 2012-04-11 | Discharge: 2012-04-11 | Disposition: A | Discharge status: Home / Self Care | Payer: Medicare Other | Source: Ambulatory Visit | Attending: Internal Medicine | Admitting: Internal Medicine

## 2012-04-11 DIAGNOSIS — R918 Other nonspecific abnormal finding of lung field: Secondary | ICD-10-CM

## 2012-04-11 DIAGNOSIS — R222 Localized swelling, mass and lump, trunk: Secondary | ICD-10-CM

## 2012-04-17 ENCOUNTER — Telehealth: Payer: Self-pay | Admitting: Internal Medicine

## 2012-04-17 NOTE — Telephone Encounter (Signed)
Pt advised and recall placed.Cheyenne Robinson, CMA

## 2012-04-17 NOTE — Telephone Encounter (Signed)
CT scan of the chest 04/11/2012 shows continued improvement of the pulmonary mass. This is real good news. She does not need biopsy. So her gut feelings about this was correct. Please tell her that. However she needs a repeat chest x-ray in 3 months and follow up with me.

## 2012-04-29 ENCOUNTER — Other Ambulatory Visit (HOSPITAL_COMMUNITY): Payer: Medicare Other

## 2012-06-19 ENCOUNTER — Encounter (HOSPITAL_COMMUNITY): Payer: Self-pay | Admitting: *Deleted

## 2012-06-19 ENCOUNTER — Emergency Department (HOSPITAL_COMMUNITY)
Admission: EM | Admit: 2012-06-19 | Discharge: 2012-06-19 | Disposition: A | Discharge status: Home / Self Care | Payer: Medicare Other | Attending: Emergency Medicine | Admitting: Emergency Medicine

## 2012-06-19 ENCOUNTER — Emergency Department (HOSPITAL_COMMUNITY): Payer: Medicare Other

## 2012-06-19 DIAGNOSIS — R531 Weakness: Secondary | ICD-10-CM

## 2012-06-19 DIAGNOSIS — R002 Palpitations: Secondary | ICD-10-CM

## 2012-06-19 DIAGNOSIS — R5381 Other malaise: Secondary | ICD-10-CM | POA: Insufficient documentation

## 2012-06-19 DIAGNOSIS — I491 Atrial premature depolarization: Secondary | ICD-10-CM

## 2012-06-19 DIAGNOSIS — Z7982 Long term (current) use of aspirin: Secondary | ICD-10-CM | POA: Insufficient documentation

## 2012-06-19 DIAGNOSIS — I10 Essential (primary) hypertension: Secondary | ICD-10-CM | POA: Insufficient documentation

## 2012-06-19 DIAGNOSIS — Z8679 Personal history of other diseases of the circulatory system: Secondary | ICD-10-CM | POA: Insufficient documentation

## 2012-06-19 DIAGNOSIS — R0602 Shortness of breath: Secondary | ICD-10-CM | POA: Insufficient documentation

## 2012-06-19 DIAGNOSIS — E7800 Pure hypercholesterolemia, unspecified: Secondary | ICD-10-CM | POA: Insufficient documentation

## 2012-06-19 DIAGNOSIS — Z79899 Other long term (current) drug therapy: Secondary | ICD-10-CM | POA: Insufficient documentation

## 2012-06-19 DIAGNOSIS — D649 Anemia, unspecified: Secondary | ICD-10-CM | POA: Insufficient documentation

## 2012-06-19 LAB — URINALYSIS, ROUTINE W REFLEX MICROSCOPIC
Hgb urine dipstick: NEGATIVE
Nitrite: NEGATIVE
Protein, ur: NEGATIVE mg/dL
Urobilinogen, UA: 0.2 mg/dL (ref 0.0–1.0)

## 2012-06-19 LAB — PRO B NATRIURETIC PEPTIDE: Pro B Natriuretic peptide (BNP): 503.4 pg/mL — ABNORMAL HIGH (ref 0–450)

## 2012-06-19 LAB — CBC
HCT: 35.2 % — ABNORMAL LOW (ref 36.0–46.0)
Hemoglobin: 12.1 g/dL (ref 12.0–15.0)
MCH: 32.4 pg (ref 26.0–34.0)
MCV: 94.4 fL (ref 78.0–100.0)
Platelets: 154 10*3/uL (ref 150–400)
RBC: 3.73 MIL/uL — ABNORMAL LOW (ref 3.87–5.11)

## 2012-06-19 LAB — BASIC METABOLIC PANEL
BUN: 16 mg/dL (ref 6–23)
CO2: 25 mEq/L (ref 19–32)
Calcium: 9.1 mg/dL (ref 8.4–10.5)
Creatinine, Ser: 1.45 mg/dL — ABNORMAL HIGH (ref 0.50–1.10)
Glucose, Bld: 107 mg/dL — ABNORMAL HIGH (ref 70–99)

## 2012-06-19 NOTE — ED Notes (Signed)
Pt states that all week she has been having a fluttering feeling in her chest. Pt states she also has sob, dizziness, and generalized weakness. Denies cp currently, states "I did have 2 pains over the past 4 days but none now."Pt in NAD currently.

## 2012-06-19 NOTE — ED Provider Notes (Signed)
History     CSN: [id number]  Arrival date & time 06/19/12  1914   First MD Initiated Contact with Patient 06/19/12 1930      Chief Complaint  Patient presents with  . Palpitations    (Consider location/radiation/quality/duration/timing/severity/associated sxs/prior treatment) HPI Comments: This is a 79 year old woman who presents today complaining of feeling generalized weakness that has been going on for the past 4 days with worsening today. She states that she feels as though her heart has been fluttering. She states generally this starts at 1 am and subsides by 9 am. Today she states the pain did not improve after taking her morning medications. She is feeling better now. She admits that she does get out of breath when she is doing her ADLs around the house. She does not feel as though her SOB is worsening, but her daughter is concerned that it is.  Her daughter mentions that she has been living in an area with very strong paint smells and is wondering if that could have something to do with her symptoms. No nausea, vomiting, abdominal pain, fever, chills, chest pain.   The history is provided by the patient. No language interpreter was used.    Past Medical History  Diagnosis Date  . Mitral valve prolapse   . High cholesterol   . HTN (hypertension)   . Anemia   . PVC (premature ventricular contraction)     Past Surgical History  Procedure Laterality Date  . Tonsillectomy      Family History  Problem Relation Age of Onset  . Alzheimer's disease Father   . Heart disease Mother     Death certificate said "hardening of the arteries"    History  Substance Use Topics  . Smoking status: Never Smoker   . Smokeless tobacco: Not on file  . Alcohol Use: No    OB History   Grav Para Term Preterm Abortions TAB SAB Ect Mult Living                  Review of Systems  Constitutional: Negative for fever and chills.  Respiratory: Positive for shortness of breath (DOE).    Cardiovascular: Negative for chest pain.  Gastrointestinal: Negative for nausea, vomiting, abdominal pain, diarrhea and constipation.  Skin: Negative for rash.  Neurological: Negative for headaches.  All other systems reviewed and are negative.    Allergies  Moxifloxacin and Sulfonamide derivatives  Home Medications   Current Outpatient Rx  Name  Route  Sig  Dispense  Refill  . aspirin 81 MG tablet   Oral   Take 81 mg by mouth daily.         . Calcium Carbonate-Vit D-Min (CALCIUM 1200 PO)   Oral   Take 1 tablet by mouth daily.         . cholecalciferol (VITAMIN D) 1000 UNITS tablet   Oral   Take 1,000 Units by mouth daily.         Marland Kitchen diltiazem (CARDIZEM CD) 120 MG 24 hr capsule   Oral   Take 120 mg by mouth daily.         Marland Kitchen lovastatin (MEVACOR) 40 MG tablet   Oral   Take 40 mg by mouth at bedtime.         . metoprolol succinate (TOPROL-XL) 25 MG 24 hr tablet   Oral   Take 1 tablet (25 mg total) by mouth 2 (two) times daily.   180 tablet   2   .  vitamin B-12 (CYANOCOBALAMIN) 1000 MCG tablet   Oral   Take 1,000 mcg by mouth daily.           BP 143/69  Pulse 70  Temp(Src) 98.2 F (36.8 C) (Oral)  Resp 16  SpO2 98%  Physical Exam  Nursing note and vitals reviewed. Constitutional: She is oriented to person, place, and time. She appears well-developed and well-nourished. No distress.  HENT:  Head: Normocephalic and atraumatic.  Right Ear: External ear normal.  Left Ear: External ear normal.  Nose: Nose normal.  Mouth/Throat: Oropharynx is clear and moist.  Eyes: Conjunctivae are normal.  Neck: Normal range of motion. No tracheal deviation present.  Cardiovascular: Normal rate, regular rhythm and normal heart sounds.   Pulmonary/Chest: Effort normal and breath sounds normal. No stridor. No respiratory distress. She has no wheezes. She has no rales.  Abdominal: Soft. She exhibits no distension. There is no tenderness.  Musculoskeletal: Normal  range of motion.  Neurological: She is alert and oriented to person, place, and time. She has normal strength.  Skin: Skin is warm and dry. She is not diaphoretic. No erythema.  Psychiatric: She has a normal mood and affect. Her behavior is normal.    ED Course  Procedures (including critical care time)  Labs Reviewed  CBC - Abnormal; Notable for the following:    RBC 3.73 (*)    HCT 35.2 (*)    All other components within normal limits  BASIC METABOLIC PANEL - Abnormal; Notable for the following:    Sodium 134 (*)    Glucose, Bld 107 (*)    Creatinine, Ser 1.45 (*)    GFR calc non Af Amer 34 (*)    GFR calc Af Amer 39 (*)    All other components within normal limits  URINALYSIS, ROUTINE W REFLEX MICROSCOPIC - Abnormal; Notable for the following:    Leukocytes, UA MODERATE (*)    All other components within normal limits  PRO B NATRIURETIC PEPTIDE - Abnormal; Notable for the following:    Pro B Natriuretic peptide (BNP) 503.4 (*)    All other components within normal limits  URINE CULTURE  URINE MICROSCOPIC-ADD ON  POCT I-STAT TROPONIN I   Dg Chest 2 View  06/19/2012  *RADIOLOGY REPORT*  Clinical Data: Short of breath  CHEST - 2 VIEW  Comparison: 02/09/2012  Findings: Stable right middle lobe oval nodule.  Hyperaeration. Borderline cardiomegaly.  Normal vascularity.  No new mass or consolidation.  No pneumothorax and no pleural effusion.  Stable thoracolumbar spine.  IMPRESSION: Stable right middle lobe nodule.  Otherwise, no active cardiopulmonary disease.   Original Report Authenticated By: Jolaine Click, M.D.      Date: 06/19/2012  Rate: 73  Rhythm: normal sinus rhythm and premature atrial contractions (PAC)  QRS Axis: normal  Intervals: normal  ST/T Wave abnormalities: nonspecific ST/T changes  Conduction Disutrbances:none  Narrative Interpretation:   Old EKG Reviewed: unchanged   1. PAC (premature atrial contraction)   2. Palpitations   3. Weakness       MDM   Patient presented today with 4 day history of weakness and palpitations. EKG showed some PACs. BNP was 503. Troponin neg. CXR neg. No concern for ACS. Vital signs WNL. Discussed case with cardiology. They recommend having her make an appointment for tomorrow morning in the office. Follow up with PCP and discuss bump in creatinine. Attending saw this patient and agrees with plan. Patient / Family / Caregiver informed of clinical course, understand  medical decision-making process, and agree with plan.        Mora Bellman, PA-C 06/20/12 [phone]

## 2012-06-19 NOTE — ED Notes (Signed)
Pt c/o her "heart fluttering" since this afternoon.  States she feels weak, and has not taken her medicine this evening. Also, unable to rest today due to this feeling.

## 2012-06-19 NOTE — ED Notes (Signed)
Dr Yelverton at bedside.  

## 2012-06-20 ENCOUNTER — Encounter: Payer: Self-pay | Admitting: Cardiovascular Disease

## 2012-06-20 ENCOUNTER — Ambulatory Visit (INDEPENDENT_AMBULATORY_CARE_PROVIDER_SITE_OTHER): Payer: Medicare Other | Admitting: Cardiovascular Disease

## 2012-06-20 VITALS — BP 130/70 | HR 70 | Ht 66.0 in | Wt 119.0 lb

## 2012-06-20 DIAGNOSIS — I4949 Other premature depolarization: Secondary | ICD-10-CM

## 2012-06-20 DIAGNOSIS — I340 Nonrheumatic mitral (valve) insufficiency: Secondary | ICD-10-CM

## 2012-06-20 DIAGNOSIS — I059 Rheumatic mitral valve disease, unspecified: Secondary | ICD-10-CM

## 2012-06-20 DIAGNOSIS — N289 Disorder of kidney and ureter, unspecified: Secondary | ICD-10-CM

## 2012-06-20 DIAGNOSIS — I493 Ventricular premature depolarization: Secondary | ICD-10-CM

## 2012-06-20 LAB — URINE CULTURE
Colony Count: NO GROWTH
Culture: NO GROWTH

## 2012-06-20 MED ORDER — DILTIAZEM HCL ER COATED BEADS 120 MG PO CP24: 120.0000 mg | ORAL_CAPSULE | Freq: Every day | ORAL | Status: DC

## 2012-06-20 NOTE — ED Provider Notes (Signed)
Medical screening examination/treatment/procedure(s) were conducted as a shared visit with non-physician practitioner(s) and myself.  I personally evaluated the patient during the encounter   Nomie Buchberger, MD 06/20/12 1511 

## 2012-06-20 NOTE — Patient Instructions (Addendum)
Your physician wants you to follow-up in:  4 months.  You will receive a reminder letter in the mail two months in advance. If you don't receive a letter, please call our office to schedule the follow-up appointment.  Your physician has recommended you make the following change in your medication:  Stop diltiazem. Start Cardizem CD 120 mg by mouth daily  Have blood work drawn on June 27, 2012 at your primary doctor's office. You have prescription for this

## 2012-06-20 NOTE — Progress Notes (Signed)
History of Present Illness: 79 yo female with history of PVCs, mitral valve disease, HTN, anemia, HLD who is here today for cardiac follow up. I saw her as a new patient 12/28/11 for a second opinion of her cardiac issues. She has been followed by Dr. Tomie China, cardiology, in [place]. She has had a workup in 2013 in [place] for fatigue and weakness including an echocardiogram which is reported to show moderate to severe mitral regurgitation. LVEF 50-55%. The RV and LV are not dilated. No other valvular disease. PA pressure mildly elevated. 48 hour Holter monitor with 20,192 PVCs, 142 PACs and several short runs of atrial tachycardia. There was mention of TEE in the future to better assess the mitral valve and possible referral to [facility] for valve surgery if it worsened. She was seen in the [facility] ED in October for palpitations.TSH was normal. EKG was normal. Chest x-ray in ED showed a pulmonary nodule. At the first visit, she c/o weakness and fatigue for several months. No chest pain. She noted palpitations daily until she was started on Toprol. I referred her to [facility] for evaluation of her lung nodule. She saw Dr. Marchelle Gearing in pulmonary clinic. PET scan low risk for malignancy. I last saw her 01/25/12. Follow up echo 04/01/12 with normal LV size and function with LVEF 55-60% and mild to moderate MR. She was seen in the [facility] ED yesterday for c/o palpitations and fatigue. EKG with sinus rhythm, one PAC. Troponin negative, H/H stable, creatine up to 1.45 from 1.1 and BNP slightly elevated at BNP. CXR unchanged.   She tells me today that she has been having more palpitations since her insurance company raised her copay on cardizem CD and she switched to diltiazem 60 mg po BID. Feels fluttering in her chest.  She has no chest pain, SOB. No dizziness, near syncope or syncope.   Primary Care Physician: Gaye Alken, NP  Past Medical History  Diagnosis Date  . Mitral valve prolapse   . High cholesterol     . HTN (hypertension)   . Anemia   . PVC (premature ventricular contraction)     Past Surgical History  Procedure Laterality Date  . Tonsillectomy      Current Outpatient Prescriptions  Medication Sig Dispense Refill  . aspirin 81 MG tablet Take 81 mg by mouth daily.      . Calcium Carbonate-Vit D-Min (CALCIUM 1200 PO) Take 1 tablet by mouth daily.      . Cholecalciferol (VITAMIN D) 1000 UNITS capsule Take 1,000 Units by mouth daily.      . cyanocobalamin 500 MCG tablet Take 500 mcg by mouth daily.      Marland Kitchen diltiazem (CARDIZEM) 60 MG tablet Take 60 mg by mouth 3 (three) times daily.       Marland Kitchen lovastatin (MEVACOR) 40 MG tablet Take 40 mg by mouth at bedtime.      . metoprolol succinate (TOPROL-XL) 25 MG 24 hr tablet Take 25 mg by mouth 2 (two) times daily. Take twice daily per patient       No current facility-administered medications for this visit.    Allergies  Allergen Reactions  . Moxifloxacin     REACTION: mental status changes  . Sulfonamide Derivatives Other (See Comments)    unknown    History   Social History  . Marital Status: Married    Spouse Name: N/A    Number of Children: 2  . Years of Education: N/A   Occupational History  .  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 Smoking status: Never Smoker   . Smokeless tobacco: Not on file  . Alcohol Use: No  . Drug Use: No  . Sexually Active: Not on file   Other Topics Concern  . Not on file   Social History Narrative  . No narrative on file    Family History  Problem Relation Age of Onset  . Alzheimer's disease Father   . Heart disease Mother     Death certificate said "hardening of the arteries"    Review of Systems:  As stated in the HPI and otherwise negative.   BP 130/70  Pulse 70  Ht 5\' 6"  (1.676 m)  Wt 119 lb (53.978 kg)  BMI 19.22 kg/m2  SpO2 98%  Physical Examination: General: Well developed, well nourished, NAD HEENT: OP clear, mucus membranes moist SKIN: warm,  dry. No rashes. Neuro: No focal deficits Musculoskeletal: Muscle strength 5/5 all ext Psychiatric: Mood and affect normal Neck: No JVD, no carotid bruits, no thyromegaly, no lymphadenopathy. Lungs:Clear bilaterally, no wheezes, rhonci, crackles Cardiovascular: Regular rate and rhythm. No murmurs, gallops or rubs. Abdomen:Soft. Bowel sounds present. Non-tender.  Extremities: No lower extremity edema. Pulses are 2 + in the bilateral DP/PT.  EKG:  Echo 04/01/12: Left ventricle: The cavity size was normal. Wall thickness was normal. Systolic function was normal. The estimated ejection fraction was in the range of 55% to 65%. - Mitral valve: Mild to moderate regurgitation. - Left atrium: The atrium was mildly dilated. - Atrial septum: No defect or patent foramen ovale was identified. - Pericardium, extracardiac: A trivial pericardial effusion was identified posterior to the heart.  Assessment and Plan:   1. Mitral Regurgitation: Mild to moderate by echo January 2014.  Repeat echo January 2015.  2. Palpitations: She is known to have PVCs and PACs.   EKG in the ED showed one PAC. Her LV function is normal. She has been having more palpitations since switching from Cardizem CD to diltiazem short acting which she has been taking twice per day only (should be Q6 hours). Will restart Cardizem CD 120 mg po Qdaily.  Will continue Toprol XL BID. If she continues to have palpitations frequently, she will call back and we will arrange a monitor.   3. Lung nodule: She is being followed by Dr. Marchelle Gearing in Pulmonary.   4. Renal insufficiency: Creatinine 1.45 in ED last night. Had been 1.1. Will have her get BMET in primary care next week.

## 2012-06-21 ENCOUNTER — Telehealth: Payer: Self-pay | Admitting: Cardiovascular Disease

## 2012-06-21 DIAGNOSIS — R002 Palpitations: Secondary | ICD-10-CM

## 2012-06-21 NOTE — Telephone Encounter (Signed)
Spoke with pt. She would like to proceed with monitor. She states she knows medication was just changed yesterday and it may take some time to adjust to this but she continues to have palpitations and fatigue and would like to schedule monitor appt at this time.  Reviewed with Dr. Clifton James and pt should have 48 hr holter. I gave pt this information and told her schedulers would contact her with appt time.

## 2012-06-21 NOTE — Telephone Encounter (Signed)
New Prob    Pt has some questions regarding upcoming monitor her and Dr. Clifton James spoke about yesterday. Would like to speak to nurse.

## 2012-06-25 ENCOUNTER — Encounter (INDEPENDENT_AMBULATORY_CARE_PROVIDER_SITE_OTHER): Payer: Medicare Other

## 2012-06-25 ENCOUNTER — Telehealth: Payer: Self-pay | Admitting: *Deleted

## 2012-06-25 DIAGNOSIS — R002 Palpitations: Secondary | ICD-10-CM

## 2012-06-25 NOTE — Telephone Encounter (Signed)
48 hr holter monitor placed on Pt 06/25/12 TK 

## 2012-06-27 ENCOUNTER — Encounter: Payer: Self-pay | Admitting: Cardiovascular Disease

## 2012-07-02 ENCOUNTER — Telehealth: Payer: Self-pay | Admitting: Internal Medicine

## 2012-07-02 NOTE — Telephone Encounter (Signed)
Spoke with patient, per on 04/17/12 patient to have 3 month follow up w chest xray. Patient had CXR done 06/19/12 and would like to know if MR can use this xray--- concerned  About insurance covering  Patient would like to wait to schedule appt until this is addressed with MR. Patient aware MR is out until next week and this will be addressed then Dr. Marchelle Gearing please advise, thank you!!

## 2012-07-03 ENCOUNTER — Telehealth: Payer: Self-pay | Admitting: Internal Medicine

## 2012-07-03 NOTE — Telephone Encounter (Signed)
Attempted to call pt x's 3 to make next ov per recall.  PT never returned calls.  Mailed recall letter 07/03/12. Cheyenne Robinson °

## 2012-07-07 NOTE — Telephone Encounter (Signed)
Chest x-ray per second 2014 shows stable right lung nodule in the middle lobe. Which means this means after her improvement on CT scan in January 2014 there is no significant change. At least it is not worse. She can cancel her followup appointment with me if she is feeling well. Please order repeat 2 view chest x-ray in 3 months and visit with me at that point in time

## 2012-07-08 NOTE — Telephone Encounter (Signed)
Called, spoke with pt.  Informed her of below per MR.  She verbalized understanding of this and stated she is feeling "fine." Reports she hasn't noticed any changes to her breathing since last OV.  She would like to hold off on scheduling OV now as ok'd below per MR.  She would like to proceed with OV in 3 months with MR with cxr.  I have placed reminder for 3 month follow up and cxr order.  Pt is aware we will call her once July schedule is out and to have cxr on same day of OV prior to seeing MR.  Advised to please call our office if anything is needed prior to this appt.  She verbalized understanding of results and instructions and voiced no further questions or concerns at this time.

## 2012-07-10 ENCOUNTER — Telehealth: Payer: Self-pay | Admitting: *Deleted

## 2012-07-10 MED ORDER — METOPROLOL SUCCINATE ER 25 MG PO TB24: 25.0000 mg | ORAL_TABLET | Freq: Two times a day (BID) | ORAL | Status: DC

## 2012-07-10 NOTE — Telephone Encounter (Signed)
I called pt to review monitor results and lab results. Left message to call back

## 2012-07-10 NOTE — Telephone Encounter (Signed)
Pt notified of results

## 2012-08-01 ENCOUNTER — Ambulatory Visit: Payer: Medicare Other | Admitting: Cardiovascular Disease

## 2012-10-07 ENCOUNTER — Telehealth: Payer: Self-pay | Admitting: Internal Medicine

## 2012-10-07 ENCOUNTER — Ambulatory Visit (INDEPENDENT_AMBULATORY_CARE_PROVIDER_SITE_OTHER): Payer: Medicare Other | Admitting: Internal Medicine

## 2012-10-07 ENCOUNTER — Encounter: Payer: Self-pay | Admitting: Internal Medicine

## 2012-10-07 VITALS — BP 140/78 | HR 74 | Temp 99.0°F | Ht 66.0 in | Wt 125.4 lb

## 2012-10-07 DIAGNOSIS — J984 Other disorders of lung: Secondary | ICD-10-CM

## 2012-10-07 NOTE — Telephone Encounter (Signed)
I spoke with Cheyenne Robinson from [facility] with a call report as follows:  1) Right Midlung zone density, seen as V-shaped lesion in the right middle lobe is less apparent. 2) Area of linear/nodular density in the lateral right upper lobe is slightly moreprominent. Consider follow-up CT of the chest for more direct comparison.   Please advise MR. Cheyenne Robinson, CMA

## 2012-10-07 NOTE — Patient Instructions (Addendum)
Do CXR at [place]-once done call us and I will look at the results and call you back.

## 2012-10-07 NOTE — Progress Notes (Signed)
Subjective:    Patient ID: Cheyenne Robinson, female    DOB: 02/09/1934, 79 y.o.   MRN: [id number]  HPI PCP is MOON,AMY, NP   Body mass index is 19.76 kg/(m^2).  Non-smoker   IOV 01/04/2012  79 year old female with mitral valve prolapse since 1970s. Felt she was being symptomatic. Saw DR Clifton James and did  CXR 12/24/11  For chest pain  And palpitations. This  showed new RML lat segment nodule compared to cxr 2009. Then had Ct 01/01/12 that showed mass (see below) and referred here. Of note, chest pain and palpitations are improved after Dr Clifton James changed medications  Reports only pulmpnary symptoms as dyspnea. Insidious onset. Few years. STable. Mild dyspnea.  Episodic. Happens at rest as well. Describes it as need to take a breath a few times and then fine. Denies exertional dysopnea, wheezing, cough, orthopnea, pnd, sinus drainage or sputum. Denies prior medical hx of asthma, chronic cough.  She is a never smoker. No passive smoking history either. No hx of cancer. No hx of pneumonia other than 4 years ago (rx as opd, reportedly diagnosed pn CXR in [place], [place]) but had UTI 6 months ago. Not interested in workup  CT chest 01/01/12 Comparison: Chest radiograph 2009 CXR,  12/24/2011 and CT chest 10/30/2003.  Findings: No pathologically enlarged mediastinal, hilar or axillary  lymph nodes. Air is seen in the right ventricular outflow tract  related to injection. Heart is mildly enlarged. No pericardial  effusion. Calcification is seen in the right breast.  Probable post infectious peribronchovascular scarring in the  posterior segment right upper lobe. This area was not imaged on  prior examinations. New V-shaped density in the lateral segment  right middle lobe, at the site of previously seen nonspecific  streaky densities on 10/30/2003. Mild bronchiectasis is seen in  the same location on the prior study. Scarring in the lingula. No  pleural fluid. Airway is unremarkable.  Incidental  imaging of the upper abdomen shows no acute findings.  No worrisome lytic or sclerotic lesions.  IMPRESSION:  V-shaped lesion in the right middle lobe, with a finger-in-glove  configuration. Differential diagnosis includes allergic  bronchopulmonary aspergillosis (ABPA) as well as malignancy.  Consultation with the [facility]  [phone]) should be considered.  Original Report Authenticated By: Reyes Ivan, M.D.       01/18/2012 : discussed at [facility] - there is a 2003 CT chest where in RML where V shaped lesion is currently, there was no V shaped lesion then but some vague nodularity/scarring, Consensus is to give abx and follow imaging   REC Please have PET scan  I will call you with results and advise next step  TELEPHONE CALL 01/17/12 CT scan chest  RML - in 2003 lateral segment there was a nodular scar but now same area  Jan 01, 2012 heart shaped density.  PET scan 01/08/12:  new inflammatory lesions that are PET hot +  Discussed at [facility]  - consensus is for her to try antibiotics for a week  (I wanted to check autoimmune first but she refused due to inconvenience)    -  so please call in augmentin 875mg  po bid x 7 days (send to [facility] on [address] in [place]. watch out for side effects sep diarrhea)   - fu with me 3-4 weeks; please give appt with CXR PA and lat on day she seems  -  at followup if lesion still present, then I  will do autoimmune workup and allergy labs IgE    OV 02/09/2012 Fu after anttibiotics for lung mass(es)  Asymptomatic. Feels augmentin did not impact her positively or negatively. Feels well.  Denies weight loss, dyspnea, cough, sputum, fever, fatigue, chills, nausea, vomit. Arthralgia. However, CXR reviewed below still shows persistence of mass. She is open to having blood work today. SHe still is surprised she has pulmonary problem but is accepting of need for workup   Dg Chest 2 View  02/09/2012   *RADIOLOGY REPORT*  Clinical Data: History of lung mass.  CHEST - 2 VIEW  Comparison: Plain films of the chest 12/24/2011 and PET CT scan 01/11/2012.  Findings: Right middle lobe pulmonary nodule seen on prior studies appears unchanged.  Left lung is clear.  There is cardiomegaly.  No pneumothorax or pleural fluid.  IMPRESSION:  1.  No change right middle lobe pulmonary nodule. 2.  Cardiomegaly without edema.   Original Report Authenticated By: Holley Dexter, M.D.     Sudie Grumbling 10/07/2012  Followup for nodule. Last seen Nov 2013. She  Is here with daughter. She has refused biopsy. She opted for rradiologic followup. Had CT scan of the chest 04/11/2012 shows continued improvement of the pulmonary mass. She then only wanted CXr followup due to concrns of $ issues. Chest x-ray 06/19/12 shows stable right lung nodule in the middle lobe.Curently well and asymptomatic. She was supposed to have cCXR here today but our IT system is down; so could not be done   Review of Systems  Constitutional: Negative for fever and unexpected weight change.  HENT: Negative for ear pain, nosebleeds, congestion, sore throat, rhinorrhea, sneezing, trouble swallowing, dental problem, postnasal drip and sinus pressure.   Eyes: Negative for redness and itching.  Respiratory: Negative for cough, chest tightness, shortness of breath and wheezing.   Cardiovascular: Negative for palpitations and leg swelling.  Gastrointestinal: Negative for nausea and vomiting.  Genitourinary: Negative for dysuria.  Musculoskeletal: Negative for joint swelling.  Skin: Negative for rash.  Neurological: Negative for headaches.  Hematological: Does not bruise/bleed easily.  Psychiatric/Behavioral: Negative for dysphoric mood. The patient is not nervous/anxious.        Objective:   Physical Exam Vitals reviewed. Constitutional: She is oriented to person, place, and time. She appears well-developed and well-nourished. No distress.  HENT:  Head:  Normocephalic and atraumatic.  Right Ear: External ear normal.  Left Ear: External ear normal.  Mouth/Throat: Oropharynx is clear and moist. No oropharyngeal exudate.  Eyes: Conjunctivae normal and EOM are normal. Pupils are equal, round, and reactive to light. Right eye exhibits no discharge. Left eye exhibits no discharge. No scleral icterus.  Neck: Normal range of motion. Neck supple. No JVD present. No tracheal deviation present. No thyromegaly present.  Cardiovascular: Normal rate, regular rhythm, normal heart sounds and intact distal pulses.  Exam reveals no gallop and no friction rub.   No murmur heard. Pulmonary/Chest: Effort normal and breath sounds normal. No respiratory distress. She has no wheezes. She has no rales. She exhibits no tenderness.  Abdominal: Soft. Bowel sounds are normal. She exhibits no distension and no mass. There is no tenderness. There is no rebound and no guarding.  Musculoskeletal: Normal range of motion. She exhibits no edema and no tenderness.  Lymphadenopathy:    She has no cervical adenopathy.  Neurological: She is alert and oriented to person, place, and time. She has normal reflexes. No cranial nerve deficit. She exhibits normal muscle tone. Coordination normal.  Skin: Skin  is warm and dry. No rash noted. She is not diaphoretic. No erythema. No pallor.  Psychiatric: She has a normal mood and affect. Her behavior is normal. Judgment and thought content normal.            Assessment & Plan:

## 2012-10-09 NOTE — Telephone Encounter (Signed)
Let her know that the old support and the chest x-ray continues to go down in size but instead there is an additional new spot near that on the right side. She needs a CT scan of the chest in 3 months here at [facility] but if she refuses she should have a chest x-ray and then see me at [facility]  Please coordinate  Dr. Kalman Shan, M.D., [facility].C.P Pulmonary and Critical Care Medicine Staff Physician [facility] Pager: [phone], If no answer or between  15:00h - 7:00h: call [phone]  10/09/2012 7:51 PM

## 2012-10-10 NOTE — Telephone Encounter (Signed)
I spoke with the pt and she wants to do CXR and f/u in 3 months. Recall placed to call pt in 3 months. Carron Curie, CMA

## 2012-10-17 NOTE — Assessment & Plan Note (Signed)
Unclear etiology for nodule She is on serial imaging followup with chest x-ray. She's not interested in CT scans of biopsy. I will have her do her chest x-ray locally at [place] review the films and give her a call  She and her daughter are in agreement

## 2012-10-23 ENCOUNTER — Ambulatory Visit (INDEPENDENT_AMBULATORY_CARE_PROVIDER_SITE_OTHER): Payer: Medicare Other | Admitting: Cardiovascular Disease

## 2012-10-23 ENCOUNTER — Encounter: Payer: Self-pay | Admitting: Cardiovascular Disease

## 2012-10-23 VITALS — BP 132/70 | HR 88 | Ht 66.0 in | Wt 126.0 lb

## 2012-10-23 DIAGNOSIS — R002 Palpitations: Secondary | ICD-10-CM

## 2012-10-23 DIAGNOSIS — I340 Nonrheumatic mitral (valve) insufficiency: Secondary | ICD-10-CM

## 2012-10-23 DIAGNOSIS — I059 Rheumatic mitral valve disease, unspecified: Secondary | ICD-10-CM

## 2012-10-23 NOTE — Patient Instructions (Signed)
Your physician wants you to follow-up in:  6 months. You will receive a reminder letter in the mail two months in advance. If you don't receive a letter, please call our office to schedule the follow-up appointment.  Your physician has requested that you have an echocardiogram. Echocardiography is a painless test that uses sound waves to create images of your heart. It provides your doctor with information about the size and shape of your heart and how well your heart's chambers and valves are working. This procedure takes approximately one hour. There are no restrictions for this procedure. To be done in January 2015    

## 2012-10-23 NOTE — Progress Notes (Signed)
History of Present Illness: 79 yo female with history of PVCs, mitral valve disease, HTN, anemia, HLD who is here today for cardiac follow up. I saw her as a new patient 12/28/11 for a second opinion of her cardiac issues. She has been followed by Dr. Tomie China, cardiology, in [place]. She has had a workup in 2013 in [place] for fatigue and weakness including an echocardiogram which is reported to show moderate to severe mitral regurgitation. LVEF 50-55%. The RV and LV are not dilated. No other valvular disease. PA pressure mildly elevated. 48 hour Holter monitor with 20,192 PVCs, 142 PACs and several short runs of atrial tachycardia. There was mention of TEE in the future to better assess the mitral valve and possible referral to [facility] for valve surgery if it worsened. She was seen in the [facility] ED in October for palpitations.TSH was normal. EKG was normal. Chest x-ray in ED showed a pulmonary nodule. At the first visit, she c/o weakness and fatigue for several months. No chest pain. She noted palpitations daily until she was started on Toprol. I referred her to [facility] for evaluation of her lung nodule. She saw Dr. Marchelle Gearing in pulmonary clinic. PET scan low risk for malignancy. Follow up echo 04/01/12 with normal LV size and function with LVEF 55-60% and mild to moderate MR. She has had PACs documented in the past in the setting of palpitations. 48 hour monitor April 2014 with short run NSVT, PACs, PVCs.   She is here today for follow up. She has no chest pain, SOB. No dizziness, near syncope or syncope. Palpitations are better.   Primary Care Physician: Gaye Alken, NP  Past Medical History  Diagnosis Date  . Mitral valve prolapse   . High cholesterol   . HTN (hypertension)   . Anemia   . PVC (premature ventricular contraction)     Past Surgical History  Procedure Laterality Date  . Tonsillectomy      Current Outpatient Prescriptions  Medication Sig Dispense Refill  . aspirin 81 MG  tablet Take 81 mg by mouth daily.      . Calcium Carbonate-Vit D-Min (CALCIUM 1200 PO) Take 1 tablet by mouth daily.      . Cholecalciferol (VITAMIN D) 1000 UNITS capsule Take 1,000 Units by mouth daily.      Marland Kitchen diltiazem (CARDIZEM CD) 120 MG 24 hr capsule Take 1 capsule (120 mg total) by mouth daily.  90 capsule  3  . lovastatin (MEVACOR) 40 MG tablet Take 40 mg by mouth at bedtime.      . metoprolol succinate (TOPROL-XL) 25 MG 24 hr tablet Take 1 tablet (25 mg total) by mouth 2 (two) times daily.  180 tablet  3   No current facility-administered medications for this visit.    Allergies  Allergen Reactions  . Moxifloxacin     REACTION: mental status changes  . Sulfonamide Derivatives Other (See Comments)    unknown    History   Social History  . Marital Status: Married    Spouse Name: N/A    Number of Children: 2  . Years of Education: N/A   Occupational History  . 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 Smoking status: Never Smoker   . Smokeless tobacco: Not on file  . Alcohol Use: No  . Drug Use: No  . Sexually Active: Not on file   Other Topics Concern  . Not on file   Social History Narrative  . No  narrative on file    Family History  Problem Relation Age of Onset  . Alzheimer's disease Father   . Heart disease Mother     Death certificate said "hardening of the arteries"    Review of Systems:  As stated in the HPI and otherwise negative.   BP 132/70  Pulse 88  Ht 5\' 6"  (1.676 m)  Wt 126 lb (57.153 kg)  BMI 20.35 kg/m2  SpO2 97%  Physical Examination: General: Well developed, well nourished, NAD HEENT: OP clear, mucus membranes moist SKIN: warm, dry. No rashes. Neuro: No focal deficits Musculoskeletal: Muscle strength 5/5 all ext Psychiatric: Mood and affect normal Neck: No JVD, no carotid bruits, no thyromegaly, no lymphadenopathy. Lungs:Clear bilaterally, no wheezes, rhonci, crackles Cardiovascular: Regular rate and rhythm.  No murmurs, gallops or rubs. Abdomen:Soft. Bowel sounds present. Non-tender.  Extremities: No lower extremity edema. Pulses are 2 + in the bilateral DP/PT.   Assessment and Plan:   1. Mitral Regurgitation: Mild to moderate by echo January 2014. Repeat echo January 2015.   2. Palpitations: She is known to have PVCs and PACs. Her LV function is normal. Continue Cardizem CD 120 mg po Qdaily. Will continue Toprol XL BID. If she continues to have palpitations frequently, she will call back and we will arrange a monitor.   3. Lung nodule: She is being followed by Dr. Marchelle Gearing in Pulmonary.

## 2013-02-05 ENCOUNTER — Ambulatory Visit (INDEPENDENT_AMBULATORY_CARE_PROVIDER_SITE_OTHER)
Admission: RE | Admit: 2013-02-05 | Discharge: 2013-02-05 | Disposition: A | Discharge status: Home / Self Care | Payer: Medicare Other | Source: Ambulatory Visit | Attending: Internal Medicine | Admitting: Internal Medicine

## 2013-02-05 ENCOUNTER — Ambulatory Visit (INDEPENDENT_AMBULATORY_CARE_PROVIDER_SITE_OTHER): Payer: Medicare Other | Admitting: Internal Medicine

## 2013-02-05 ENCOUNTER — Encounter: Payer: Self-pay | Admitting: Internal Medicine

## 2013-02-05 VITALS — BP 130/82 | HR 75 | Temp 98.3°F | Ht 66.0 in | Wt 131.2 lb

## 2013-02-05 DIAGNOSIS — R911 Solitary pulmonary nodule: Secondary | ICD-10-CM

## 2013-02-05 DIAGNOSIS — R222 Localized swelling, mass and lump, trunk: Secondary | ICD-10-CM

## 2013-02-05 DIAGNOSIS — R918 Other nonspecific abnormal finding of lung field: Secondary | ICD-10-CM

## 2013-02-05 NOTE — Patient Instructions (Signed)
#  Lung nodule   - have followup cxr  - will call with results  - if stable or impropving will give directions on followup with primary care

## 2013-02-05 NOTE — Progress Notes (Signed)
Subjective:    Patient ID: Cheyenne Robinson, female    DOB: 1933-10-01, 79 y.o.   MRN: [id number]  HPI  PCP is MOON,AMY, NP   Body mass index is 19.76 kg/(m^2).  Non-smoker   IOV 01/04/2012  79 year old female with mitral valve prolapse since 1970s. Felt she was being symptomatic. Saw DR Clifton James and did  CXR 12/24/11  For chest pain  And palpitations. This  showed new RML lat segment nodule compared to cxr 2009. Then had Ct 01/01/12 that showed mass (see below) and referred here. Of note, chest pain and palpitations are improved after Dr Clifton James changed medications  Reports only pulmpnary symptoms as dyspnea. Insidious onset. Few years. STable. Mild dyspnea.  Episodic. Happens at rest as well. Describes it as need to take a breath a few times and then fine. Denies exertional dysopnea, wheezing, cough, orthopnea, pnd, sinus drainage or sputum. Denies prior medical hx of asthma, chronic cough.  She is a never smoker. No passive smoking history either. No hx of cancer. No hx of pneumonia other than 4 years ago (rx as opd, reportedly diagnosed pn CXR in [place], [place]) but had UTI 6 months ago. Not interested in workup  CT chest 01/01/12 Comparison: Chest radiograph 2009 CXR,  12/24/2011 and CT chest 10/30/2003.  Findings: No pathologically enlarged mediastinal, hilar or axillary  lymph nodes. Air is seen in the right ventricular outflow tract  related to injection. Heart is mildly enlarged. No pericardial  effusion. Calcification is seen in the right breast.  Probable post infectious peribronchovascular scarring in the  posterior segment right upper lobe. This area was not imaged on  prior examinations. New V-shaped density in the lateral segment  right middle lobe, at the site of previously seen nonspecific  streaky densities on 10/30/2003. Mild bronchiectasis is seen in  the same location on the prior study. Scarring in the lingula. No  pleural fluid. Airway is unremarkable.  Incidental  imaging of the upper abdomen shows no acute findings.  No worrisome lytic or sclerotic lesions.  IMPRESSION:  V-shaped lesion in the right middle lobe, with a finger-in-glove  configuration. Differential diagnosis includes allergic  bronchopulmonary aspergillosis (ABPA) as well as malignancy.  Consultation with the [facility]  [phone]) should be considered.  Original Report Authenticated By: Reyes Ivan, M.D.       01/18/2012 : discussed at [facility] - there is a 2003 CT chest where in RML where V shaped lesion is currently, there was no V shaped lesion then but some vague nodularity/scarring, Consensus is to give abx and follow imaging   REC Please have PET scan  I will call you with results and advise next step  TELEPHONE CALL 01/17/12 CT scan chest  RML - in 2003 lateral segment there was a nodular scar but now same area  Jan 01, 2012 heart shaped density.  PET scan 01/08/12:  new inflammatory lesions that are PET hot +  Discussed at [facility]  - consensus is for her to try antibiotics for a week  (I wanted to check autoimmune first but she refused due to inconvenience)    -  so please call in augmentin 875mg  po bid x 7 days (send to [facility] on [address] in [place]. watch out for side effects sep diarrhea)   - fu with me 3-4 weeks; please give appt with CXR PA and lat on day she seems  -  at followup if lesion still present, then  I will do autoimmune workup and allergy labs IgE    OV 02/09/2012 Fu after anttibiotics for lung mass(es)  Asymptomatic. Feels augmentin did not impact her positively or negatively. Feels well.  Denies weight loss, dyspnea, cough, sputum, fever, fatigue, chills, nausea, vomit. Arthralgia. However, CXR reviewed below still shows persistence of mass. She is open to having blood work today. SHe still is surprised she has pulmonary problem but is accepting of need for workup   Dg Chest 2 View  02/09/2012   *RADIOLOGY REPORT*  Clinical Data: History of lung mass.  CHEST - 2 VIEW  Comparison: Plain films of the chest 12/24/2011 and PET CT scan 01/11/2012.  Findings: Right middle lobe pulmonary nodule seen on prior studies appears unchanged.  Left lung is clear.  There is cardiomegaly.  No pneumothorax or pleural fluid.  IMPRESSION:  1.  No change right middle lobe pulmonary nodule. 2.  Cardiomegaly without edema.   Original Report Authenticated By: Holley Dexter, M.D.     Sudie Grumbling 10/07/2012  Followup for nodule. Last seen Nov 2013. She  Is here with daughter. She has refused biopsy. She opted for rradiologic followup. Had CT scan of the chest 04/11/2012 shows continued improvement of the pulmonary mass. She then only wanted CXr followup due to concrns of $ issues. Chest x-ray 06/19/12 shows stable right lung nodule in the middle lobe.Curently well and asymptomatic. She was supposed to have cCXR here today but our IT system is down; so could not be done   1) Right Midlung zone density, seen as V-shaped lesion in the right middle lobe is less apparent. 2) Area of linear/nodular density in the lateral right upper lobe is slightly moreprominent. Consider follow-up CT of the chest for more direct comparison.  - refused CT; will do CXR instead   OV 02/05/2013  Followup for nodule. Last seen July 2014. She presents again with her daughter Suzette Battiest. She again is reluctant to CT scan base followup or biopsy. She only first chest x-ray best followup. Currently well and asymptomatic. The chest x-ray is pending today she is up-to-date with flu shot. There is no further issues   Review of Systems  Constitutional: Negative for fever and unexpected weight change.  HENT: Negative for congestion, dental problem, ear pain, nosebleeds, postnasal drip, rhinorrhea, sinus pressure, sneezing, sore throat and trouble swallowing.   Eyes: Negative for redness and itching.  Respiratory: Negative for cough, chest tightness,  shortness of breath and wheezing.   Cardiovascular: Positive for palpitations. Negative for leg swelling.  Gastrointestinal: Positive for anal bleeding. Negative for nausea and vomiting.  Genitourinary: Negative for dysuria.  Musculoskeletal: Negative for joint swelling.  Skin: Negative for rash.  Neurological: Negative for headaches.  Hematological: Bruises/bleeds easily.  Psychiatric/Behavioral: Negative for dysphoric mood. The patient is not nervous/anxious.        Objective:   Physical Exam  Vitals reviewed. Constitutional: She is oriented to person, place, and time. She appears well-developed and well-nourished. No distress.  HENT:  Head: Normocephalic and atraumatic.  Right Ear: External ear normal.  Left Ear: External ear normal.  Mouth/Throat: Oropharynx is clear and moist. No oropharyngeal exudate.  Eyes: Conjunctivae and EOM are normal. Pupils are equal, round, and reactive to light. Right eye exhibits no discharge. Left eye exhibits no discharge. No scleral icterus.  Neck: Normal range of motion. Neck supple. No JVD present. No tracheal deviation present. No thyromegaly present.  Cardiovascular: Normal rate, regular rhythm, normal heart sounds and intact distal pulses.  Exam reveals no gallop and no friction rub.   No murmur heard. Pulmonary/Chest: Effort normal and breath sounds normal. No respiratory distress. She has no wheezes. She has no rales. She exhibits no tenderness.  Abdominal: Soft. Bowel sounds are normal. She exhibits no distension and no mass. There is no tenderness. There is no rebound and no guarding.  Musculoskeletal: Normal range of motion. She exhibits no edema and no tenderness.  Lymphadenopathy:    She has no cervical adenopathy.  Neurological: She is alert and oriented to person, place, and time. She has normal reflexes. No cranial nerve deficit. She exhibits normal muscle tone. Coordination normal.  Skin: Skin is warm and dry. No rash noted. She is  not diaphoretic. No erythema. No pallor.  Psychiatric: She has a normal mood and affect. Her behavior is normal. Judgment and thought content normal.          Assessment & Plan:

## 2013-02-06 ENCOUNTER — Telehealth: Payer: Self-pay | Admitting: Internal Medicine

## 2013-02-06 NOTE — Telephone Encounter (Signed)
Dg Chest 2 View  02/05/2013   CLINICAL DATA:  Follow-up lung nodule  EXAM: CHEST  2 VIEW  COMPARISON:  10/07/2012  FINDINGS: Minimal enlargement of cardiac silhouette.  Mediastinal contours and pulmonary vascularity normal.  Minimal atherosclerotic calcification aorta.  Emphysematous changes without pulmonary infiltrate, pleural effusion or pneumothorax.  Persistent nodular density in mid right lung approximately 19 x 9 mm unchanged.  An additional slight of questionable nodularity more superiorly in right upper lobe on the previous exam has improved.  Diffuse osseous demineralization.  IMPRESSION: Minimal enlargement of cardiac silhouette.  COPD changes with stable nodular density right mid lung.   Electronically Signed   By: Ulyses Southward M.D.   On: 02/05/2013 12:47   _ Old spot R mid lung - same - Then the next spot Right upper lobe - improving   Plan  - tel her to fu with PCP with serial CXR every 3-6 months for total 2 years  I wil send note to Hadassah Pais, NP   Dr. Kalman Shan, M.D., [facility].C.P Pulmonary and Critical Care Medicine Staff Physician [facility] Critical [facility] Pager: [phone], If no answer or between  15:00h - 7:00h: call [phone]  02/06/2013 6:00 PM

## 2013-02-07 NOTE — Telephone Encounter (Signed)
Pt advised. Deshawn Witty, CMA  

## 2013-02-09 NOTE — Assessment & Plan Note (Signed)
#  Lung nodule / mass - likely benign. Refuses CT scan. Refuses intervention  PLAN  - have followup cxr  - will call with results  - if stable or impropving will give directions on followup with primary care; such as do CXR q3-6 months for total 2 years

## 2013-04-10 ENCOUNTER — Encounter: Payer: Self-pay | Admitting: Cardiovascular Disease

## 2013-04-10 ENCOUNTER — Ambulatory Visit (HOSPITAL_COMMUNITY): Payer: Medicare Other | Attending: Cardiovascular Disease | Admitting: Radiology

## 2013-04-10 DIAGNOSIS — R222 Localized swelling, mass and lump, trunk: Secondary | ICD-10-CM | POA: Insufficient documentation

## 2013-04-10 DIAGNOSIS — I079 Rheumatic tricuspid valve disease, unspecified: Secondary | ICD-10-CM | POA: Insufficient documentation

## 2013-04-10 DIAGNOSIS — I340 Nonrheumatic mitral (valve) insufficiency: Secondary | ICD-10-CM

## 2013-04-10 DIAGNOSIS — E785 Hyperlipidemia, unspecified: Secondary | ICD-10-CM | POA: Insufficient documentation

## 2013-04-10 DIAGNOSIS — I359 Nonrheumatic aortic valve disorder, unspecified: Secondary | ICD-10-CM | POA: Insufficient documentation

## 2013-04-10 DIAGNOSIS — R0602 Shortness of breath: Secondary | ICD-10-CM | POA: Insufficient documentation

## 2013-04-10 DIAGNOSIS — I10 Essential (primary) hypertension: Secondary | ICD-10-CM | POA: Insufficient documentation

## 2013-04-10 DIAGNOSIS — R002 Palpitations: Secondary | ICD-10-CM | POA: Insufficient documentation

## 2013-04-10 DIAGNOSIS — I059 Rheumatic mitral valve disease, unspecified: Secondary | ICD-10-CM

## 2013-04-10 NOTE — Progress Notes (Signed)
Echocardiogram performed.  

## 2013-04-24 ENCOUNTER — Ambulatory Visit (INDEPENDENT_AMBULATORY_CARE_PROVIDER_SITE_OTHER): Payer: Medicare Other | Admitting: Cardiovascular Disease

## 2013-04-24 ENCOUNTER — Encounter: Payer: Self-pay | Admitting: Cardiovascular Disease

## 2013-04-24 ENCOUNTER — Encounter (INDEPENDENT_AMBULATORY_CARE_PROVIDER_SITE_OTHER): Payer: Self-pay

## 2013-04-24 VITALS — BP 142/70 | HR 70 | Ht 66.0 in | Wt 127.0 lb

## 2013-04-24 DIAGNOSIS — I493 Ventricular premature depolarization: Secondary | ICD-10-CM

## 2013-04-24 DIAGNOSIS — I059 Rheumatic mitral valve disease, unspecified: Secondary | ICD-10-CM

## 2013-04-24 DIAGNOSIS — I4949 Other premature depolarization: Secondary | ICD-10-CM

## 2013-04-24 DIAGNOSIS — R002 Palpitations: Secondary | ICD-10-CM

## 2013-04-24 DIAGNOSIS — I340 Nonrheumatic mitral (valve) insufficiency: Secondary | ICD-10-CM

## 2013-04-24 MED ORDER — METOPROLOL SUCCINATE ER 25 MG PO TB24: 25.0000 mg | ORAL_TABLET | Freq: Two times a day (BID) | ORAL | Status: DC

## 2013-04-24 MED ORDER — DILTIAZEM HCL ER COATED BEADS 120 MG PO CP24: 120.0000 mg | ORAL_CAPSULE | Freq: Every day | ORAL | Status: AC

## 2013-04-24 NOTE — Progress Notes (Signed)
History of Present Illness: 80 yo female with history of PVCs, mitral regurgitation, HTN, anemia, HLD who is here today for cardiac follow up. I saw her as a new patient 12/28/11 for a second opinion of her cardiac issues. She has been followed by Dr. Geraldo Pitter, cardiology, in [place]. She had a workup in 2013 in [place] for fatigue and weakness including an echocardiogram which was reported to show moderate to severe mitral regurgitation. LVEF 50-55%. The RV and LV were not dilated. No other valvular disease. PA pressure mildly elevated. 48 hour Holter monitor with 20,192 PVCs, 142 PACs and several short runs of atrial tachycardia. There was mention of TEE in the future to better assess the mitral valve and possible referral to [facility] for valve surgery if it worsened. She was seen in the [facility] ED in October for palpitations.TSH was normal. EKG was normal. Chest x-ray in ED showed a pulmonary nodule. At the first visit, she c/o weakness and fatigue for several months. No chest pain. She noted palpitations daily until she was started on Toprol. I referred her to [facility] for evaluation of her lung nodule. She saw Dr. Chase Caller in pulmonary clinic. PET scan low risk for malignancy. Follow up echo 04/01/12 with normal LV size and function with LVEF 55-60% and mild to moderate MR. She has had PACs documented in the past in the setting of palpitations. 48 hour monitor April 2014 with short run NSVT, PACs, PVCs. Repeat echo 04/10/13 with LVEF=55-60%, mild to moderate MR, mild AI.   She is here today for follow up. She has no chest pain, SOB. No dizziness, near syncope or syncope. Palpitations are better.   Primary Care Physician: Laverna Peace, NP  Past Medical History  Diagnosis Date  . Mitral valve prolapse   . High cholesterol   . HTN (hypertension)   . Anemia   . PVC (premature ventricular contraction)     Past Surgical History  Procedure Laterality Date  . Tonsillectomy      Current  Outpatient Prescriptions  Medication Sig Dispense Refill  . aspirin 81 MG tablet Take 81 mg by mouth daily.      . Calcium Carbonate-Vit D-Min (CALCIUM 1200 PO) Take 1 tablet by mouth daily.      . Cholecalciferol (VITAMIN D) 1000 UNITS capsule Take 1,000 Units by mouth daily.      Marland Kitchen diltiazem (CARDIZEM CD) 120 MG 24 hr capsule Take 1 capsule (120 mg total) by mouth daily.  90 capsule  3  . lovastatin (MEVACOR) 40 MG tablet Take 40 mg by mouth at bedtime.      . metoprolol succinate (TOPROL-XL) 25 MG 24 hr tablet Take 1 tablet (25 mg total) by mouth 2 (two) times daily.  180 tablet  3   No current facility-administered medications for this visit.    Allergies  Allergen Reactions  . Moxifloxacin     REACTION: mental status changes  . Sulfonamide Derivatives Other (See Comments)    unknown    History   Social History  . Marital Status: Married    Spouse Name: N/A    Number of Children: 2  . Years of Education: N/A   Occupational History  . 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 Smoking status: Never Smoker   . Smokeless tobacco: Not on file  . Alcohol Use: No  . Drug Use: No  . Sexual Activity: Not on file   Other Topics Concern  . Not  on file   Social History Narrative  . No narrative on file    Family History  Problem Relation Age of Onset  . Alzheimer's disease Father   . Heart disease Mother     Death certificate said "hardening of the arteries"    Review of Systems:  As stated in the HPI and otherwise negative.   There were no vitals taken for this visit.  Physical Examination: General: Well developed, well nourished, NAD HEENT: OP clear, mucus membranes moist SKIN: warm, dry. No rashes. Neuro: No focal deficits Musculoskeletal: Muscle strength 5/5 all ext Psychiatric: Mood and affect normal Neck: No JVD, no carotid bruits, no thyromegaly, no lymphadenopathy. Lungs:Clear bilaterally, no wheezes, rhonci, crackles Cardiovascular:  Regular rate and rhythm. Systolic murmur noted. No gallops or rubs. Abdomen:Soft. Bowel sounds present. Non-tender.  Extremities: No lower extremity edema. Pulses are 2 + in the bilateral DP/PT.  Echo 04/10/13: Left ventricle: The cavity size was normal. Wall thickness was normal. Systolic function was normal. The estimated ejection fraction was in the range of 55% to 60%. Wall motion was normal; there were no regional wall motion abnormalities. Left ventricular diastolic function parameters were normal. - Aortic valve: Mild regurgitation. - Mitral valve: Mild to moderate regurgitation directed centrally. - Atrial septum: No defect or patent foramen ovale was Identified.  EKG: NSR, rate 70 bpm.   Assessment and Plan:   1. Mitral Regurgitation: Mild to moderate by echo January 2015. Repeat echo January 2016.   2. Palpitations: She is known to have PVCs and PACs. Her LV function is normal. Continue Cardizem CD 120 mg po Qdaily. Will continue Toprol XL BID. If she continues to have palpitations frequently, she will call back and we will arrange a monitor.   3. Lung nodule: She is being followed by Dr. Chase Caller in Pulmonary.

## 2013-04-24 NOTE — Patient Instructions (Signed)
Your physician wants you to follow-up in:  12 months.  You will receive a reminder letter in the mail two months in advance. If you don't receive a letter, please call our office to schedule the follow-up appointment.   

## 2013-12-01 IMAGING — CT CT CHEST SUPER D W/O CM
2 of 4 series · 15 of 36 positions shown, 18 images · non-contrast
Comparison: PET 01/11/2012 and CT chest 01/01/2012, 10/30/2003.

CLINICAL DATA: Evaluate lung mass.

CT CHEST WITHOUT CONTRAST
TECHNIQUE: Multidetector CT imaging of the chest was performed
using thin slice collimation for electromagnetic bronchoscopy
planning purposes, without intravenous contrast.

[Series 3: lung · axial · 0.63mm/px · z∈[-301,-46]mm · 12 of 61 slices shown, 15 images]
[im 5/61  mediastinal]
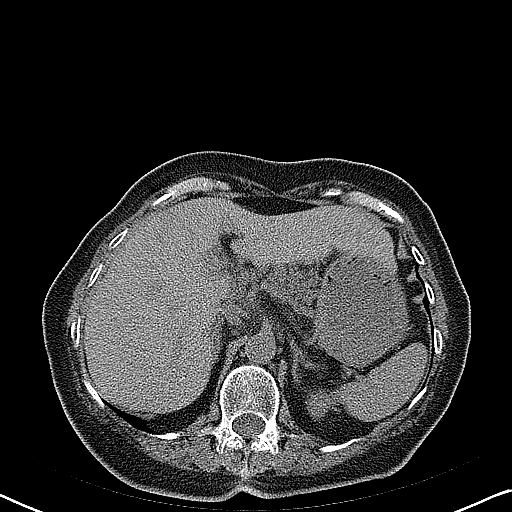
[im 5/61  lung]
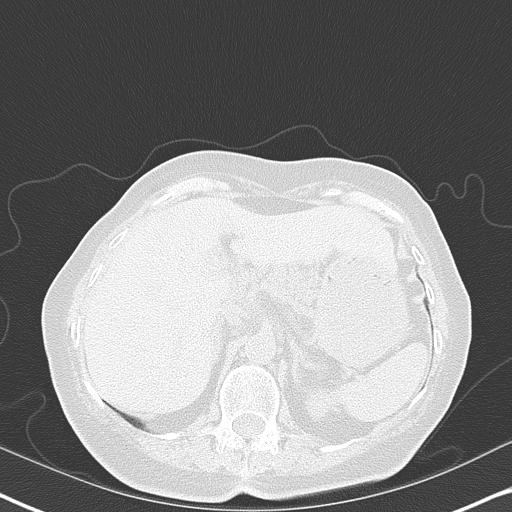
[im 10/61  lung]
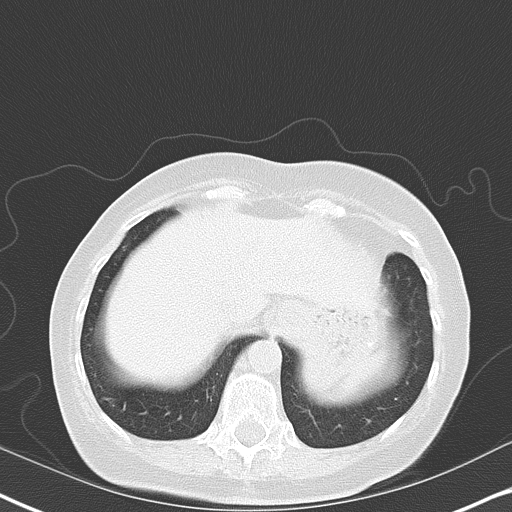
[im 14/61  lung]
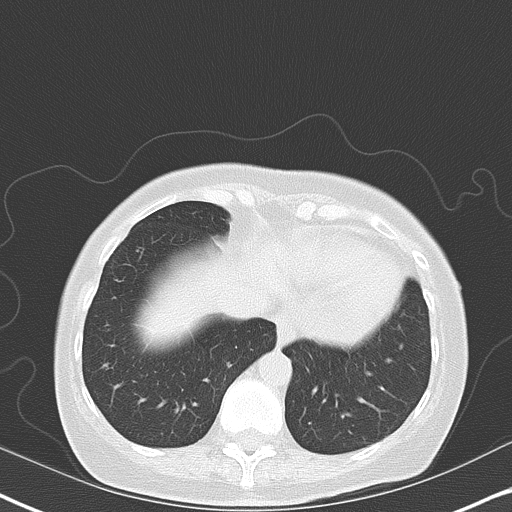
[im 19/61  lung]
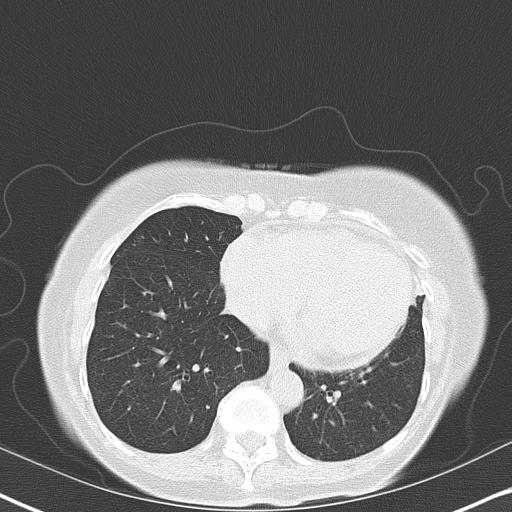
[im 24/61  mediastinal]
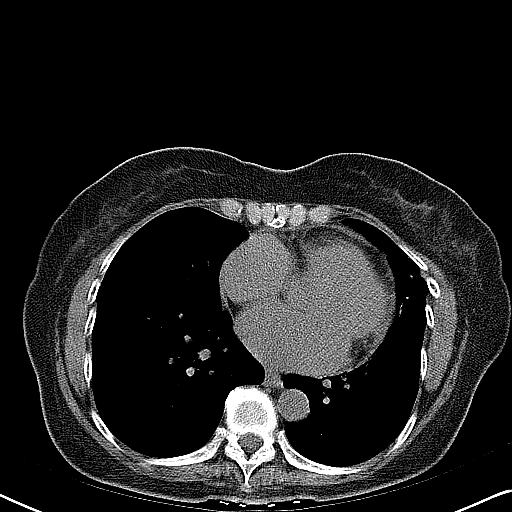
[im 24/61  lung]
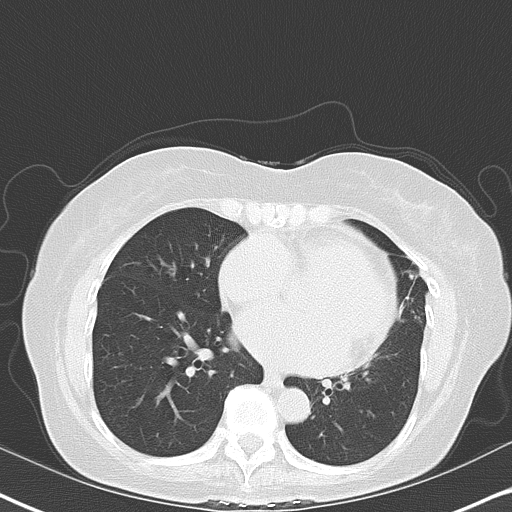
[im 28/61  lung]
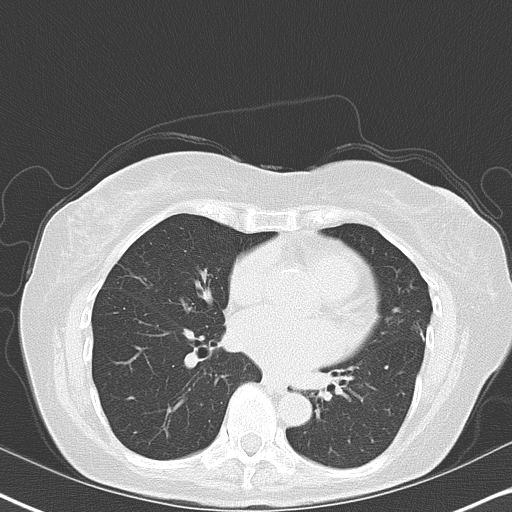
[im 33/61  lung]
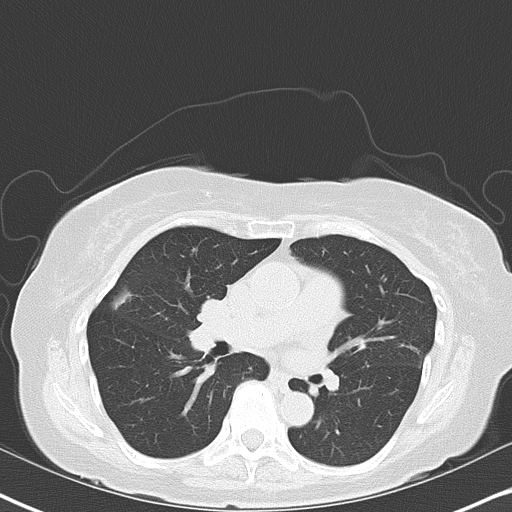
[im 37/61  lung]
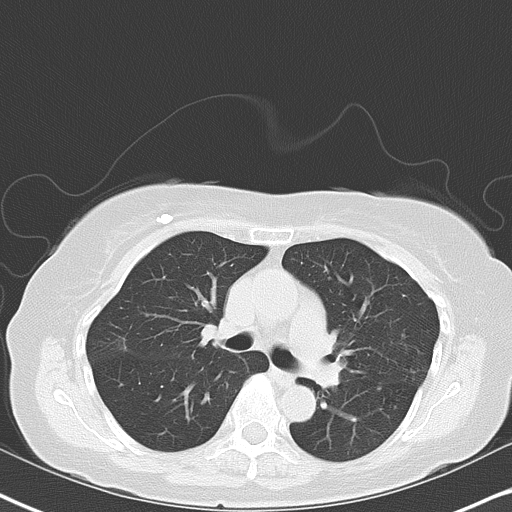
[im 42/61  mediastinal]
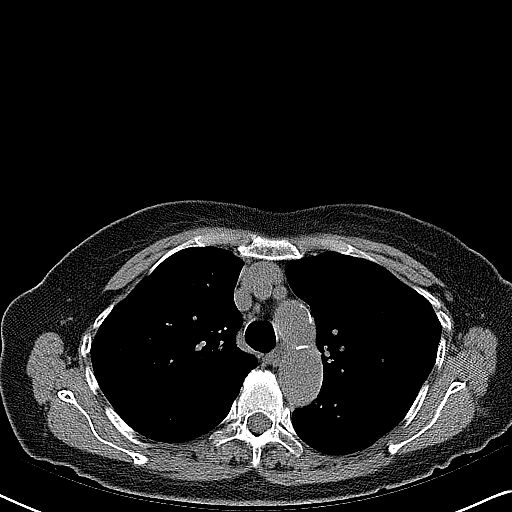
[im 42/61  lung]
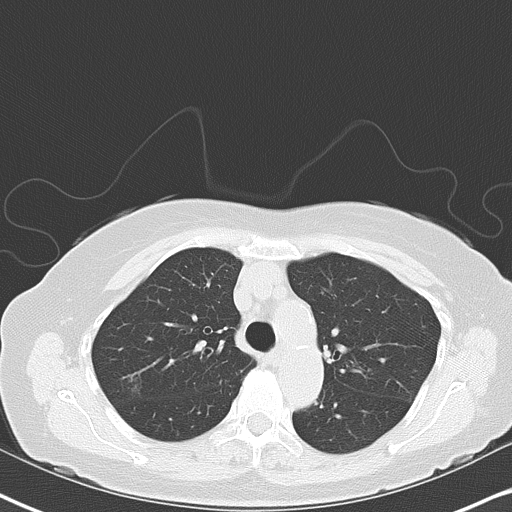
[im 47/61  lung]
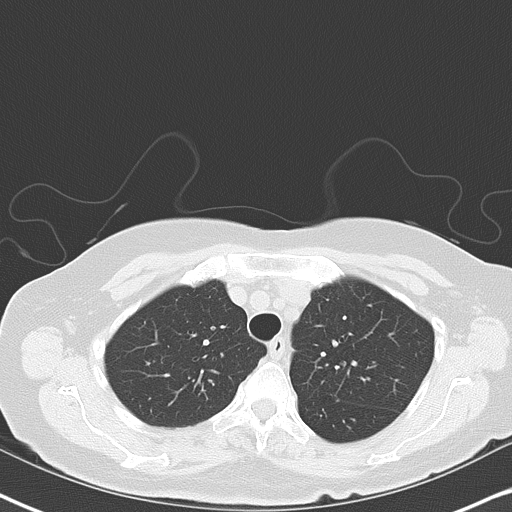
[im 51/61  lung]
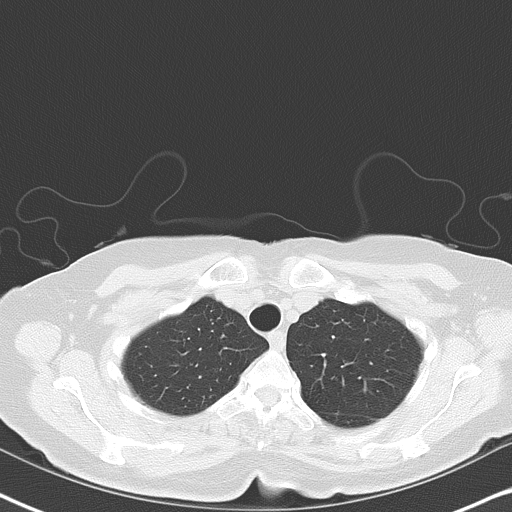
[im 56/61  lung]
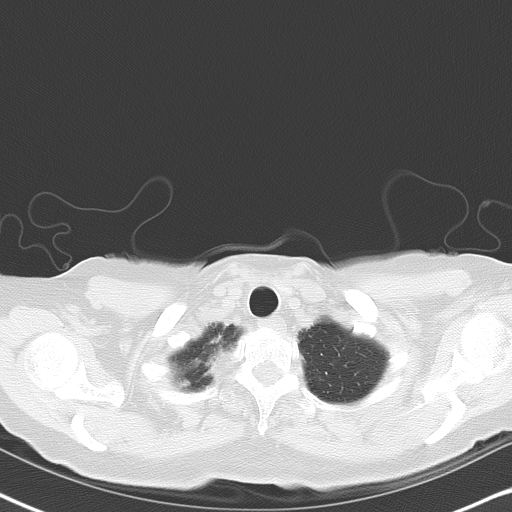

[Series 602: cor · coronal · 0.63mm/px · 3 of 89 slices shown]
[im 18/89  lung]
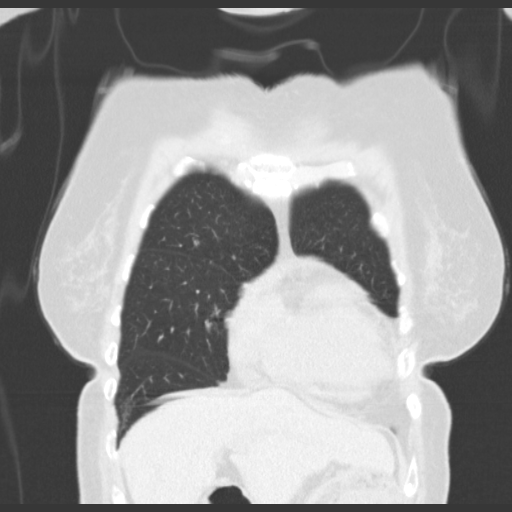
[im 36/89  lung]
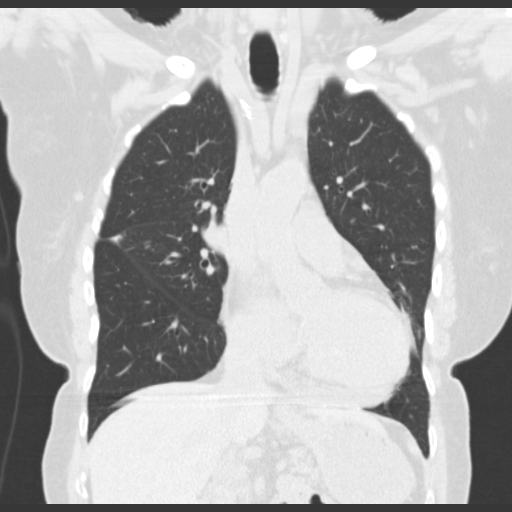
[im 53/89  lung]
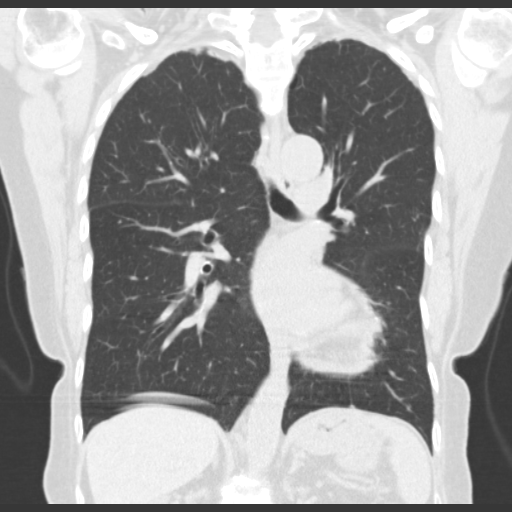

[15 of 36 positions shown; findings below may reference images not displayed]

FINDINGS: No pathologically enlarged mediastinal or axillary lymph
nodes.  Hilar regions are difficult to definitively evaluate
without IV contrast.  Heart size is at the upper limits of normal.
Small amount of pericardial fluid may be physiologic.  Pre
pericardiac lymph nodes are small in size.

Mild biapical pleural parenchymal scarring.  Interval clearing of
peribronchovascular nodularity in the right upper lobe, with
residual post infectious scarring.  New mucoid impaction in the
right upper lobe (image 21).  A V-shaped density in the lateral
segment right middle lobe is less prominent than on 01/01/2012,
measuring approximately 1.6 x 1.8 cm (previously 2.0 x 2.1 cm).
New nodular density in the inferior right middle lobe measures 4 mm
(image 47).  Additional scattered areas of new mucoid impaction are
seen in the right middle and right lower lobes. Scarring in the
lingula.  No pleural fluid.  Airway is unremarkable.

Incidental imaging of the upper abdomen shows no acute findings.
No worrisome lytic or sclerotic lesions.
IMPRESSION: 1.  Slight interval improvement in V-shaped lesion in the lateral
segment right middle lobe, without complete resolution.  Additional
areas of scattered mucoid impaction in the right lung, new from
01/01/2012.  Findings favor an infectious or inflammatory etiology.
Malignancy in the right middle lobe cannot be definitively
excluded.
2.  Small nodular density in the right middle lobe is new and may
be due to mucoid impaction.  Continued attention on follow-up exams
is warranted.

## 2014-03-10 ENCOUNTER — Other Ambulatory Visit: Payer: Self-pay | Admitting: Cardiovascular Disease

## 2014-04-27 DIAGNOSIS — I471 Supraventricular tachycardia: Secondary | ICD-10-CM | POA: Diagnosis not present

## 2014-04-27 DIAGNOSIS — F4320 Adjustment disorder, unspecified: Secondary | ICD-10-CM | POA: Diagnosis not present

## 2014-04-27 DIAGNOSIS — D539 Nutritional anemia, unspecified: Secondary | ICD-10-CM | POA: Diagnosis not present

## 2014-04-27 DIAGNOSIS — I10 Essential (primary) hypertension: Secondary | ICD-10-CM | POA: Diagnosis not present

## 2014-04-27 DIAGNOSIS — E785 Hyperlipidemia, unspecified: Secondary | ICD-10-CM | POA: Diagnosis not present

## 2014-04-29 DIAGNOSIS — E780 Pure hypercholesterolemia: Secondary | ICD-10-CM | POA: Diagnosis not present

## 2014-04-29 DIAGNOSIS — I10 Essential (primary) hypertension: Secondary | ICD-10-CM | POA: Diagnosis not present

## 2014-04-29 DIAGNOSIS — R0602 Shortness of breath: Secondary | ICD-10-CM | POA: Diagnosis not present

## 2014-04-29 DIAGNOSIS — J449 Chronic obstructive pulmonary disease, unspecified: Secondary | ICD-10-CM | POA: Diagnosis not present

## 2014-04-29 DIAGNOSIS — R002 Palpitations: Secondary | ICD-10-CM | POA: Diagnosis not present

## 2014-04-30 DIAGNOSIS — F4320 Adjustment disorder, unspecified: Secondary | ICD-10-CM | POA: Diagnosis not present

## 2014-04-30 DIAGNOSIS — R002 Palpitations: Secondary | ICD-10-CM | POA: Diagnosis not present

## 2014-04-30 DIAGNOSIS — Z6820 Body mass index (BMI) 20.0-20.9, adult: Secondary | ICD-10-CM | POA: Diagnosis not present

## 2014-05-08 DIAGNOSIS — F4320 Adjustment disorder, unspecified: Secondary | ICD-10-CM | POA: Diagnosis not present

## 2014-05-08 DIAGNOSIS — Z1389 Encounter for screening for other disorder: Secondary | ICD-10-CM | POA: Diagnosis not present

## 2014-05-08 DIAGNOSIS — Z6820 Body mass index (BMI) 20.0-20.9, adult: Secondary | ICD-10-CM | POA: Diagnosis not present

## 2014-05-08 DIAGNOSIS — Z9181 History of falling: Secondary | ICD-10-CM | POA: Diagnosis not present

## 2014-05-08 DIAGNOSIS — R002 Palpitations: Secondary | ICD-10-CM | POA: Diagnosis not present

## 2014-05-19 DIAGNOSIS — R002 Palpitations: Secondary | ICD-10-CM | POA: Diagnosis not present

## 2014-05-19 DIAGNOSIS — I10 Essential (primary) hypertension: Secondary | ICD-10-CM | POA: Diagnosis not present

## 2014-05-19 DIAGNOSIS — I341 Nonrheumatic mitral (valve) prolapse: Secondary | ICD-10-CM | POA: Diagnosis not present

## 2014-05-19 DIAGNOSIS — I471 Supraventricular tachycardia: Secondary | ICD-10-CM | POA: Diagnosis not present

## 2014-05-25 DIAGNOSIS — I471 Supraventricular tachycardia: Secondary | ICD-10-CM | POA: Diagnosis not present

## 2014-05-27 DIAGNOSIS — I471 Supraventricular tachycardia: Secondary | ICD-10-CM | POA: Diagnosis not present

## 2014-05-27 DIAGNOSIS — R002 Palpitations: Secondary | ICD-10-CM | POA: Diagnosis not present

## 2014-06-01 ENCOUNTER — Ambulatory Visit: Payer: Medicare Other | Admitting: Cardiovascular Disease

## 2014-06-01 DIAGNOSIS — R002 Palpitations: Secondary | ICD-10-CM | POA: Diagnosis not present

## 2014-06-01 DIAGNOSIS — Z6821 Body mass index (BMI) 21.0-21.9, adult: Secondary | ICD-10-CM | POA: Diagnosis not present

## 2014-06-01 DIAGNOSIS — I10 Essential (primary) hypertension: Secondary | ICD-10-CM | POA: Diagnosis not present

## 2014-06-01 DIAGNOSIS — F4320 Adjustment disorder, unspecified: Secondary | ICD-10-CM | POA: Diagnosis not present

## 2014-06-05 DIAGNOSIS — Z6821 Body mass index (BMI) 21.0-21.9, adult: Secondary | ICD-10-CM | POA: Diagnosis not present

## 2014-06-05 DIAGNOSIS — I10 Essential (primary) hypertension: Secondary | ICD-10-CM | POA: Diagnosis not present

## 2014-06-05 DIAGNOSIS — I471 Supraventricular tachycardia: Secondary | ICD-10-CM | POA: Diagnosis not present

## 2014-06-05 DIAGNOSIS — F4320 Adjustment disorder, unspecified: Secondary | ICD-10-CM | POA: Diagnosis not present

## 2014-06-05 DIAGNOSIS — R531 Weakness: Secondary | ICD-10-CM | POA: Diagnosis not present

## 2014-06-16 DIAGNOSIS — I471 Supraventricular tachycardia: Secondary | ICD-10-CM | POA: Diagnosis not present

## 2014-06-16 DIAGNOSIS — R002 Palpitations: Secondary | ICD-10-CM | POA: Diagnosis not present

## 2014-06-23 DIAGNOSIS — Z6821 Body mass index (BMI) 21.0-21.9, adult: Secondary | ICD-10-CM | POA: Diagnosis not present

## 2014-06-23 DIAGNOSIS — I10 Essential (primary) hypertension: Secondary | ICD-10-CM | POA: Diagnosis not present

## 2014-06-23 DIAGNOSIS — F4320 Adjustment disorder, unspecified: Secondary | ICD-10-CM | POA: Diagnosis not present

## 2014-07-23 DIAGNOSIS — I10 Essential (primary) hypertension: Secondary | ICD-10-CM | POA: Diagnosis not present

## 2014-07-23 DIAGNOSIS — Z9181 History of falling: Secondary | ICD-10-CM | POA: Diagnosis not present

## 2014-07-23 DIAGNOSIS — Z6821 Body mass index (BMI) 21.0-21.9, adult: Secondary | ICD-10-CM | POA: Diagnosis not present

## 2014-07-23 DIAGNOSIS — F4320 Adjustment disorder, unspecified: Secondary | ICD-10-CM | POA: Diagnosis not present

## 2014-08-13 DIAGNOSIS — Z139 Encounter for screening, unspecified: Secondary | ICD-10-CM | POA: Diagnosis not present

## 2014-08-13 DIAGNOSIS — Z6821 Body mass index (BMI) 21.0-21.9, adult: Secondary | ICD-10-CM | POA: Diagnosis not present

## 2014-08-13 DIAGNOSIS — F4320 Adjustment disorder, unspecified: Secondary | ICD-10-CM | POA: Diagnosis not present

## 2014-08-13 DIAGNOSIS — I10 Essential (primary) hypertension: Secondary | ICD-10-CM | POA: Diagnosis not present

## 2014-09-17 DIAGNOSIS — I10 Essential (primary) hypertension: Secondary | ICD-10-CM | POA: Diagnosis not present

## 2014-09-17 DIAGNOSIS — E785 Hyperlipidemia, unspecified: Secondary | ICD-10-CM | POA: Diagnosis not present

## 2014-09-17 DIAGNOSIS — Z6821 Body mass index (BMI) 21.0-21.9, adult: Secondary | ICD-10-CM | POA: Diagnosis not present

## 2014-09-17 DIAGNOSIS — I471 Supraventricular tachycardia: Secondary | ICD-10-CM | POA: Diagnosis not present

## 2014-09-17 DIAGNOSIS — Z139 Encounter for screening, unspecified: Secondary | ICD-10-CM | POA: Diagnosis not present

## 2014-10-06 DIAGNOSIS — H25811 Combined forms of age-related cataract, right eye: Secondary | ICD-10-CM | POA: Diagnosis not present

## 2014-10-06 DIAGNOSIS — H35371 Puckering of macula, right eye: Secondary | ICD-10-CM | POA: Diagnosis not present

## 2014-10-13 DIAGNOSIS — H259 Unspecified age-related cataract: Secondary | ICD-10-CM | POA: Diagnosis not present

## 2014-10-13 DIAGNOSIS — I10 Essential (primary) hypertension: Secondary | ICD-10-CM | POA: Diagnosis not present

## 2014-10-13 DIAGNOSIS — E785 Hyperlipidemia, unspecified: Secondary | ICD-10-CM | POA: Diagnosis not present

## 2014-10-13 DIAGNOSIS — Z7982 Long term (current) use of aspirin: Secondary | ICD-10-CM | POA: Diagnosis not present

## 2014-10-13 DIAGNOSIS — H25811 Combined forms of age-related cataract, right eye: Secondary | ICD-10-CM | POA: Diagnosis not present

## 2014-10-13 DIAGNOSIS — Z79899 Other long term (current) drug therapy: Secondary | ICD-10-CM | POA: Diagnosis not present

## 2014-12-23 DIAGNOSIS — Z6821 Body mass index (BMI) 21.0-21.9, adult: Secondary | ICD-10-CM | POA: Diagnosis not present

## 2014-12-23 DIAGNOSIS — I471 Supraventricular tachycardia: Secondary | ICD-10-CM | POA: Diagnosis not present

## 2014-12-23 DIAGNOSIS — Z23 Encounter for immunization: Secondary | ICD-10-CM | POA: Diagnosis not present

## 2014-12-23 DIAGNOSIS — I10 Essential (primary) hypertension: Secondary | ICD-10-CM | POA: Diagnosis not present

## 2014-12-23 DIAGNOSIS — F4320 Adjustment disorder, unspecified: Secondary | ICD-10-CM | POA: Diagnosis not present

## 2015-04-01 DIAGNOSIS — D539 Nutritional anemia, unspecified: Secondary | ICD-10-CM | POA: Diagnosis not present

## 2015-04-01 DIAGNOSIS — I10 Essential (primary) hypertension: Secondary | ICD-10-CM | POA: Diagnosis not present

## 2015-04-01 DIAGNOSIS — Z1389 Encounter for screening for other disorder: Secondary | ICD-10-CM | POA: Diagnosis not present

## 2015-04-01 DIAGNOSIS — I471 Supraventricular tachycardia: Secondary | ICD-10-CM | POA: Diagnosis not present

## 2015-04-01 DIAGNOSIS — Z6821 Body mass index (BMI) 21.0-21.9, adult: Secondary | ICD-10-CM | POA: Diagnosis not present

## 2015-04-01 DIAGNOSIS — E785 Hyperlipidemia, unspecified: Secondary | ICD-10-CM | POA: Diagnosis not present

## 2015-04-20 DIAGNOSIS — I341 Nonrheumatic mitral (valve) prolapse: Secondary | ICD-10-CM | POA: Insufficient documentation

## 2015-04-20 DIAGNOSIS — E785 Hyperlipidemia, unspecified: Secondary | ICD-10-CM | POA: Insufficient documentation

## 2015-04-20 DIAGNOSIS — I471 Supraventricular tachycardia, unspecified: Secondary | ICD-10-CM

## 2015-04-20 DIAGNOSIS — I10 Essential (primary) hypertension: Secondary | ICD-10-CM

## 2015-04-20 HISTORY — DX: Essential (primary) hypertension: I10

## 2015-04-20 HISTORY — DX: Supraventricular tachycardia: I47.1

## 2015-04-20 HISTORY — DX: Supraventricular tachycardia, unspecified: I47.10

## 2015-04-20 HISTORY — DX: Hyperlipidemia, unspecified: E78.5

## 2015-04-22 DIAGNOSIS — I471 Supraventricular tachycardia: Secondary | ICD-10-CM | POA: Diagnosis not present

## 2015-04-22 DIAGNOSIS — I341 Nonrheumatic mitral (valve) prolapse: Secondary | ICD-10-CM | POA: Diagnosis not present

## 2015-04-22 DIAGNOSIS — E785 Hyperlipidemia, unspecified: Secondary | ICD-10-CM | POA: Diagnosis not present

## 2015-05-20 DIAGNOSIS — L920 Granuloma annulare: Secondary | ICD-10-CM | POA: Diagnosis not present

## 2015-05-20 DIAGNOSIS — L83 Acanthosis nigricans: Secondary | ICD-10-CM | POA: Diagnosis not present

## 2015-05-20 DIAGNOSIS — L219 Seborrheic dermatitis, unspecified: Secondary | ICD-10-CM | POA: Diagnosis not present

## 2015-06-07 DIAGNOSIS — L219 Seborrheic dermatitis, unspecified: Secondary | ICD-10-CM | POA: Diagnosis not present

## 2015-06-07 DIAGNOSIS — L400 Psoriasis vulgaris: Secondary | ICD-10-CM | POA: Diagnosis not present

## 2015-07-09 DIAGNOSIS — E785 Hyperlipidemia, unspecified: Secondary | ICD-10-CM | POA: Diagnosis not present

## 2015-07-09 DIAGNOSIS — I471 Supraventricular tachycardia: Secondary | ICD-10-CM | POA: Diagnosis not present

## 2015-07-09 DIAGNOSIS — Z6821 Body mass index (BMI) 21.0-21.9, adult: Secondary | ICD-10-CM | POA: Diagnosis not present

## 2015-07-09 DIAGNOSIS — I10 Essential (primary) hypertension: Secondary | ICD-10-CM | POA: Diagnosis not present

## 2015-11-09 DIAGNOSIS — Z9181 History of falling: Secondary | ICD-10-CM | POA: Diagnosis not present

## 2015-11-09 DIAGNOSIS — E785 Hyperlipidemia, unspecified: Secondary | ICD-10-CM | POA: Diagnosis not present

## 2015-11-09 DIAGNOSIS — D539 Nutritional anemia, unspecified: Secondary | ICD-10-CM | POA: Diagnosis not present

## 2015-11-09 DIAGNOSIS — E559 Vitamin D deficiency, unspecified: Secondary | ICD-10-CM | POA: Diagnosis not present

## 2015-11-09 DIAGNOSIS — I471 Supraventricular tachycardia: Secondary | ICD-10-CM | POA: Diagnosis not present

## 2016-03-10 DIAGNOSIS — E785 Hyperlipidemia, unspecified: Secondary | ICD-10-CM | POA: Diagnosis not present

## 2016-03-10 DIAGNOSIS — I471 Supraventricular tachycardia: Secondary | ICD-10-CM | POA: Diagnosis not present

## 2016-03-10 DIAGNOSIS — D539 Nutritional anemia, unspecified: Secondary | ICD-10-CM | POA: Diagnosis not present

## 2016-03-10 DIAGNOSIS — I10 Essential (primary) hypertension: Secondary | ICD-10-CM | POA: Diagnosis not present

## 2016-03-10 DIAGNOSIS — Z6821 Body mass index (BMI) 21.0-21.9, adult: Secondary | ICD-10-CM | POA: Diagnosis not present

## 2016-04-10 DIAGNOSIS — L301 Dyshidrosis [pompholyx]: Secondary | ICD-10-CM | POA: Diagnosis not present

## 2016-04-10 DIAGNOSIS — L400 Psoriasis vulgaris: Secondary | ICD-10-CM | POA: Diagnosis not present

## 2016-05-01 DIAGNOSIS — L400 Psoriasis vulgaris: Secondary | ICD-10-CM | POA: Diagnosis not present

## 2016-05-01 DIAGNOSIS — L820 Inflamed seborrheic keratosis: Secondary | ICD-10-CM | POA: Diagnosis not present

## 2016-05-03 DIAGNOSIS — I471 Supraventricular tachycardia: Secondary | ICD-10-CM | POA: Diagnosis not present

## 2016-05-03 DIAGNOSIS — I341 Nonrheumatic mitral (valve) prolapse: Secondary | ICD-10-CM | POA: Diagnosis not present

## 2016-05-03 DIAGNOSIS — E785 Hyperlipidemia, unspecified: Secondary | ICD-10-CM | POA: Diagnosis not present

## 2016-05-03 DIAGNOSIS — I10 Essential (primary) hypertension: Secondary | ICD-10-CM | POA: Diagnosis not present

## 2016-07-11 DIAGNOSIS — D539 Nutritional anemia, unspecified: Secondary | ICD-10-CM | POA: Diagnosis not present

## 2016-07-11 DIAGNOSIS — I10 Essential (primary) hypertension: Secondary | ICD-10-CM | POA: Diagnosis not present

## 2016-07-11 DIAGNOSIS — I471 Supraventricular tachycardia: Secondary | ICD-10-CM | POA: Diagnosis not present

## 2016-07-11 DIAGNOSIS — E785 Hyperlipidemia, unspecified: Secondary | ICD-10-CM | POA: Diagnosis not present

## 2016-07-11 DIAGNOSIS — Z1389 Encounter for screening for other disorder: Secondary | ICD-10-CM | POA: Diagnosis not present

## 2016-08-29 DIAGNOSIS — I10 Essential (primary) hypertension: Secondary | ICD-10-CM | POA: Diagnosis not present

## 2016-08-29 DIAGNOSIS — Z6821 Body mass index (BMI) 21.0-21.9, adult: Secondary | ICD-10-CM | POA: Diagnosis not present

## 2016-08-29 DIAGNOSIS — R600 Localized edema: Secondary | ICD-10-CM | POA: Diagnosis not present

## 2016-10-30 DIAGNOSIS — R600 Localized edema: Secondary | ICD-10-CM | POA: Diagnosis not present

## 2016-10-30 DIAGNOSIS — Z6821 Body mass index (BMI) 21.0-21.9, adult: Secondary | ICD-10-CM | POA: Diagnosis not present

## 2016-10-30 DIAGNOSIS — R197 Diarrhea, unspecified: Secondary | ICD-10-CM | POA: Diagnosis not present

## 2016-11-13 DIAGNOSIS — R197 Diarrhea, unspecified: Secondary | ICD-10-CM | POA: Diagnosis not present

## 2016-11-17 DIAGNOSIS — R102 Pelvic and perineal pain: Secondary | ICD-10-CM | POA: Diagnosis not present

## 2016-11-17 DIAGNOSIS — R197 Diarrhea, unspecified: Secondary | ICD-10-CM | POA: Diagnosis not present

## 2016-12-06 DIAGNOSIS — I10 Essential (primary) hypertension: Secondary | ICD-10-CM | POA: Diagnosis not present

## 2016-12-06 DIAGNOSIS — Z139 Encounter for screening, unspecified: Secondary | ICD-10-CM | POA: Diagnosis not present

## 2016-12-06 DIAGNOSIS — Z9181 History of falling: Secondary | ICD-10-CM | POA: Diagnosis not present

## 2016-12-06 DIAGNOSIS — R197 Diarrhea, unspecified: Secondary | ICD-10-CM | POA: Diagnosis not present

## 2016-12-06 DIAGNOSIS — Z1389 Encounter for screening for other disorder: Secondary | ICD-10-CM | POA: Diagnosis not present

## 2016-12-06 DIAGNOSIS — E785 Hyperlipidemia, unspecified: Secondary | ICD-10-CM | POA: Diagnosis not present

## 2017-01-31 DIAGNOSIS — E0590 Thyrotoxicosis, unspecified without thyrotoxic crisis or storm: Secondary | ICD-10-CM | POA: Diagnosis not present

## 2017-01-31 DIAGNOSIS — E785 Hyperlipidemia, unspecified: Secondary | ICD-10-CM | POA: Diagnosis not present

## 2017-01-31 DIAGNOSIS — I471 Supraventricular tachycardia: Secondary | ICD-10-CM | POA: Diagnosis not present

## 2017-01-31 DIAGNOSIS — R600 Localized edema: Secondary | ICD-10-CM | POA: Diagnosis not present

## 2017-01-31 DIAGNOSIS — D539 Nutritional anemia, unspecified: Secondary | ICD-10-CM | POA: Diagnosis not present

## 2017-01-31 DIAGNOSIS — I10 Essential (primary) hypertension: Secondary | ICD-10-CM | POA: Diagnosis not present

## 2017-02-28 DIAGNOSIS — Z1231 Encounter for screening mammogram for malignant neoplasm of breast: Secondary | ICD-10-CM | POA: Diagnosis not present

## 2017-02-28 DIAGNOSIS — N959 Unspecified menopausal and perimenopausal disorder: Secondary | ICD-10-CM | POA: Diagnosis not present

## 2017-02-28 DIAGNOSIS — Z0000 Encounter for general adult medical examination without abnormal findings: Secondary | ICD-10-CM | POA: Diagnosis not present

## 2017-02-28 DIAGNOSIS — E785 Hyperlipidemia, unspecified: Secondary | ICD-10-CM | POA: Diagnosis not present

## 2017-02-28 DIAGNOSIS — Z136 Encounter for screening for cardiovascular disorders: Secondary | ICD-10-CM | POA: Diagnosis not present

## 2017-02-28 DIAGNOSIS — Z9181 History of falling: Secondary | ICD-10-CM | POA: Diagnosis not present

## 2017-02-28 DIAGNOSIS — Z1331 Encounter for screening for depression: Secondary | ICD-10-CM | POA: Diagnosis not present

## 2017-02-28 DIAGNOSIS — Z139 Encounter for screening, unspecified: Secondary | ICD-10-CM | POA: Diagnosis not present

## 2017-03-21 DIAGNOSIS — E039 Hypothyroidism, unspecified: Secondary | ICD-10-CM | POA: Diagnosis not present

## 2017-03-21 DIAGNOSIS — Z139 Encounter for screening, unspecified: Secondary | ICD-10-CM | POA: Diagnosis not present

## 2017-04-02 ENCOUNTER — Encounter: Payer: Self-pay | Admitting: *Deleted

## 2017-04-02 ENCOUNTER — Other Ambulatory Visit: Payer: Self-pay | Admitting: *Deleted
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
## 2017-05-02 ENCOUNTER — Ambulatory Visit (INDEPENDENT_AMBULATORY_CARE_PROVIDER_SITE_OTHER): Payer: Medicare Other | Admitting: Cardiology

## 2017-05-02 ENCOUNTER — Encounter: Payer: Self-pay | Admitting: Cardiology

## 2017-05-02 VITALS — BP 118/62 | HR 78 | Ht 66.0 in | Wt 126.8 lb

## 2017-05-02 DIAGNOSIS — I471 Supraventricular tachycardia: Secondary | ICD-10-CM | POA: Diagnosis not present

## 2017-05-02 DIAGNOSIS — R002 Palpitations: Secondary | ICD-10-CM

## 2017-05-02 DIAGNOSIS — I341 Nonrheumatic mitral (valve) prolapse: Secondary | ICD-10-CM | POA: Diagnosis not present

## 2017-05-02 MED ORDER — METOPROLOL SUCCINATE ER 25 MG PO TB24: 25.0000 mg | ORAL_TABLET | Freq: Two times a day (BID) | ORAL | 3 refills | Status: DC

## 2017-05-02 NOTE — Patient Instructions (Addendum)
Medication Instructions:  Your physician recommends that you continue on your current medications as directed. Please refer to the Current Medication list given to you today.   Labwork: None.  Testing/Procedures: None.  Follow-Up: Your physician wants you to follow-up in: 1 year. You will receive a reminder letter in the mail two months in advance. If you don't receive a letter, please call our office to schedule the follow-up appointment.   Any Other Special Instructions Will Be Listed Below (If Applicable).     If you need a refill on your cardiac medications before your next appointment, please call your pharmacy.     1. Avoid all over-the-counter antihistamines except Claritin/Loratadine and Zyrtec/Cetrizine. 2. Avoid all combination including cold sinus allergies flu decongestant and sleep medications 3. You can use Robitussin DM Mucinex and Mucinex DM for cough. 4. can use Tylenol aspirin ibuprofen and naproxen but no combinations such as sleep or sinus. 

## 2017-05-15 DIAGNOSIS — E785 Hyperlipidemia, unspecified: Secondary | ICD-10-CM | POA: Diagnosis not present

## 2017-05-15 DIAGNOSIS — D539 Nutritional anemia, unspecified: Secondary | ICD-10-CM | POA: Diagnosis not present

## 2017-05-15 DIAGNOSIS — E039 Hypothyroidism, unspecified: Secondary | ICD-10-CM | POA: Diagnosis not present

## 2017-05-15 DIAGNOSIS — I471 Supraventricular tachycardia: Secondary | ICD-10-CM | POA: Diagnosis not present

## 2017-07-05 DIAGNOSIS — J181 Lobar pneumonia, unspecified organism: Secondary | ICD-10-CM | POA: Diagnosis not present

## 2017-07-05 DIAGNOSIS — R6889 Other general symptoms and signs: Secondary | ICD-10-CM | POA: Diagnosis not present

## 2017-07-05 DIAGNOSIS — Z6822 Body mass index (BMI) 22.0-22.9, adult: Secondary | ICD-10-CM | POA: Diagnosis not present

## 2017-07-13 DIAGNOSIS — Z6821 Body mass index (BMI) 21.0-21.9, adult: Secondary | ICD-10-CM | POA: Diagnosis not present

## 2017-07-13 DIAGNOSIS — J180 Bronchopneumonia, unspecified organism: Secondary | ICD-10-CM | POA: Diagnosis not present

## 2017-08-23 DIAGNOSIS — R05 Cough: Secondary | ICD-10-CM | POA: Diagnosis not present

## 2017-08-23 DIAGNOSIS — R531 Weakness: Secondary | ICD-10-CM | POA: Diagnosis not present

## 2017-08-23 DIAGNOSIS — E538 Deficiency of other specified B group vitamins: Secondary | ICD-10-CM | POA: Diagnosis not present

## 2017-08-30 DIAGNOSIS — D539 Nutritional anemia, unspecified: Secondary | ICD-10-CM | POA: Diagnosis not present

## 2017-08-30 DIAGNOSIS — R531 Weakness: Secondary | ICD-10-CM | POA: Diagnosis not present

## 2017-08-30 DIAGNOSIS — R05 Cough: Secondary | ICD-10-CM | POA: Diagnosis not present

## 2017-09-06 DIAGNOSIS — D539 Nutritional anemia, unspecified: Secondary | ICD-10-CM | POA: Diagnosis not present

## 2017-09-10 DIAGNOSIS — D539 Nutritional anemia, unspecified: Secondary | ICD-10-CM | POA: Diagnosis not present

## 2017-09-10 DIAGNOSIS — Z1339 Encounter for screening examination for other mental health and behavioral disorders: Secondary | ICD-10-CM | POA: Diagnosis not present

## 2017-09-10 DIAGNOSIS — E785 Hyperlipidemia, unspecified: Secondary | ICD-10-CM | POA: Diagnosis not present

## 2017-09-10 DIAGNOSIS — E039 Hypothyroidism, unspecified: Secondary | ICD-10-CM | POA: Diagnosis not present

## 2017-09-10 DIAGNOSIS — I471 Supraventricular tachycardia: Secondary | ICD-10-CM | POA: Diagnosis not present

## 2017-09-13 DIAGNOSIS — D539 Nutritional anemia, unspecified: Secondary | ICD-10-CM | POA: Diagnosis not present

## 2017-10-15 DIAGNOSIS — D539 Nutritional anemia, unspecified: Secondary | ICD-10-CM | POA: Diagnosis not present

## 2017-11-15 DIAGNOSIS — E538 Deficiency of other specified B group vitamins: Secondary | ICD-10-CM | POA: Diagnosis not present

## 2017-12-17 DIAGNOSIS — E538 Deficiency of other specified B group vitamins: Secondary | ICD-10-CM | POA: Diagnosis not present

## 2018-01-15 DIAGNOSIS — E039 Hypothyroidism, unspecified: Secondary | ICD-10-CM | POA: Diagnosis not present

## 2018-01-15 DIAGNOSIS — E538 Deficiency of other specified B group vitamins: Secondary | ICD-10-CM | POA: Diagnosis not present

## 2018-01-15 DIAGNOSIS — E785 Hyperlipidemia, unspecified: Secondary | ICD-10-CM | POA: Diagnosis not present

## 2018-01-15 DIAGNOSIS — I471 Supraventricular tachycardia: Secondary | ICD-10-CM | POA: Diagnosis not present

## 2018-01-15 DIAGNOSIS — D539 Nutritional anemia, unspecified: Secondary | ICD-10-CM | POA: Diagnosis not present

## 2018-03-19 DIAGNOSIS — E785 Hyperlipidemia, unspecified: Secondary | ICD-10-CM | POA: Diagnosis not present

## 2018-03-19 DIAGNOSIS — Z9181 History of falling: Secondary | ICD-10-CM | POA: Diagnosis not present

## 2018-03-19 DIAGNOSIS — Z139 Encounter for screening, unspecified: Secondary | ICD-10-CM | POA: Diagnosis not present

## 2018-03-19 DIAGNOSIS — Z0000 Encounter for general adult medical examination without abnormal findings: Secondary | ICD-10-CM | POA: Diagnosis not present
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
## 2018-05-06 ENCOUNTER — Ambulatory Visit: Payer: Medicare Other | Admitting: Cardiology

## 2018-05-06 ENCOUNTER — Encounter: Payer: Self-pay | Admitting: Cardiology

## 2018-05-06 VITALS — BP 172/80 | HR 77 | Ht 66.0 in | Wt 126.8 lb

## 2018-05-06 DIAGNOSIS — I341 Nonrheumatic mitral (valve) prolapse: Secondary | ICD-10-CM

## 2018-05-06 DIAGNOSIS — I10 Essential (primary) hypertension: Secondary | ICD-10-CM

## 2018-05-06 DIAGNOSIS — I471 Supraventricular tachycardia: Secondary | ICD-10-CM | POA: Diagnosis not present

## 2018-05-06 DIAGNOSIS — R002 Palpitations: Secondary | ICD-10-CM

## 2018-05-06 MED ORDER — METOPROLOL SUCCINATE ER 25 MG PO TB24: 25.0000 mg | ORAL_TABLET | Freq: Two times a day (BID) | ORAL | 3 refills | Status: DC

## 2018-05-06 NOTE — Patient Instructions (Addendum)
Medication Instructions:  Your physician recommends that you continue on your current medications as directed. Please refer to the Current Medication list given to you today.  If you need a refill on your cardiac medications before your next appointment, please call your pharmacy.   Lab work: None  If you have labs (blood work) drawn today and your tests are completely normal, you will receive your results only by: Marland Kitchen MyChart Message (if you have MyChart) OR . A paper copy in the mail If you have any lab test that is abnormal or we need to change your treatment, we will call you to review the results.  Testing/Procedures: You had an EKG today.   Follow-Up: At [facility], you and your health needs are our priority.  As part of our continuing mission to provide you with exceptional heart care, we have created designated Provider Care Teams.  These Care Teams include your primary Cardiologist (physician) and Advanced Practice Providers (APPs -  Physician Assistants and Nurse Practitioners) who all work together to provide you with the care you need, when you need it. You will need a follow up appointment in 1 years.  Please call our office 2 months in advance to schedule this appointment.       1. Avoid all over-the-counter antihistamines except Claritin/Loratadine and Zyrtec/Cetrizine. 2. Avoid all combination including cold sinus allergies flu decongestant and sleep medications 3. You can use Robitussin DM Mucinex and Mucinex DM for cough. 4. can use Tylenol aspirin ibuprofen and naproxen but no combinations such as sleep or sinus.

## 2018-05-17 DIAGNOSIS — R600 Localized edema: Secondary | ICD-10-CM | POA: Diagnosis not present

## 2018-05-17 DIAGNOSIS — D539 Nutritional anemia, unspecified: Secondary | ICD-10-CM | POA: Diagnosis not present

## 2018-05-17 DIAGNOSIS — I10 Essential (primary) hypertension: Secondary | ICD-10-CM | POA: Diagnosis not present

## 2018-05-17 DIAGNOSIS — E785 Hyperlipidemia, unspecified: Secondary | ICD-10-CM | POA: Diagnosis not present

## 2018-07-26 DIAGNOSIS — N289 Disorder of kidney and ureter, unspecified: Secondary | ICD-10-CM | POA: Diagnosis not present

## 2018-07-26 DIAGNOSIS — Z6821 Body mass index (BMI) 21.0-21.9, adult: Secondary | ICD-10-CM | POA: Diagnosis not present

## 2018-07-26 DIAGNOSIS — I10 Essential (primary) hypertension: Secondary | ICD-10-CM | POA: Diagnosis not present

## 2018-08-05 DIAGNOSIS — I10 Essential (primary) hypertension: Secondary | ICD-10-CM | POA: Diagnosis not present

## 2018-08-05 DIAGNOSIS — Z6821 Body mass index (BMI) 21.0-21.9, adult: Secondary | ICD-10-CM | POA: Diagnosis not present

## 2018-08-05 DIAGNOSIS — N289 Disorder of kidney and ureter, unspecified: Secondary | ICD-10-CM | POA: Diagnosis not present

## 2018-08-13 DIAGNOSIS — Z6821 Body mass index (BMI) 21.0-21.9, adult: Secondary | ICD-10-CM | POA: Diagnosis not present

## 2018-08-13 DIAGNOSIS — I10 Essential (primary) hypertension: Secondary | ICD-10-CM | POA: Diagnosis not present

## 2018-08-20 DIAGNOSIS — I10 Essential (primary) hypertension: Secondary | ICD-10-CM | POA: Diagnosis not present

## 2018-08-20 DIAGNOSIS — Z6821 Body mass index (BMI) 21.0-21.9, adult: Secondary | ICD-10-CM | POA: Diagnosis not present

## 2018-09-03 DIAGNOSIS — Z6821 Body mass index (BMI) 21.0-21.9, adult: Secondary | ICD-10-CM | POA: Diagnosis not present

## 2018-09-03 DIAGNOSIS — I10 Essential (primary) hypertension: Secondary | ICD-10-CM | POA: Diagnosis not present

## 2018-09-03 DIAGNOSIS — N289 Disorder of kidney and ureter, unspecified: Secondary | ICD-10-CM | POA: Diagnosis not present

## 2018-09-27 DIAGNOSIS — I10 Essential (primary) hypertension: Secondary | ICD-10-CM | POA: Diagnosis not present

## 2018-10-01 DIAGNOSIS — I10 Essential (primary) hypertension: Secondary | ICD-10-CM | POA: Diagnosis not present

## 2018-10-01 DIAGNOSIS — E1165 Type 2 diabetes mellitus with hyperglycemia: Secondary | ICD-10-CM | POA: Diagnosis not present

## 2018-10-01 DIAGNOSIS — D539 Nutritional anemia, unspecified: Secondary | ICD-10-CM | POA: Diagnosis not present

## 2018-10-01 DIAGNOSIS — M1990 Unspecified osteoarthritis, unspecified site: Secondary | ICD-10-CM | POA: Diagnosis not present

## 2018-10-01 DIAGNOSIS — E039 Hypothyroidism, unspecified: Secondary | ICD-10-CM | POA: Diagnosis not present

## 2018-10-01 DIAGNOSIS — E785 Hyperlipidemia, unspecified: Secondary | ICD-10-CM | POA: Diagnosis not present

## 2018-10-01 DIAGNOSIS — E559 Vitamin D deficiency, unspecified: Secondary | ICD-10-CM | POA: Diagnosis not present

## 2018-10-01 DIAGNOSIS — Z23 Encounter for immunization: Secondary | ICD-10-CM | POA: Diagnosis not present

## 2018-10-01 DIAGNOSIS — Z139 Encounter for screening, unspecified: Secondary | ICD-10-CM | POA: Diagnosis not present

## 2018-11-19 ENCOUNTER — Telehealth: Payer: Self-pay | Admitting: Cardiology

## 2018-11-19 DIAGNOSIS — I341 Nonrheumatic mitral (valve) prolapse: Secondary | ICD-10-CM

## 2018-11-19 NOTE — Telephone Encounter (Signed)
paient has been having some dizzy spells and then is really weak and she is concerned that it may be her MVP, Patient wants to be seen sooner.

## 2018-11-19 NOTE — Telephone Encounter (Signed)
Patient and her daughter [name] notified that Dr Bettina Gavia would like her to see Cheyenne Montana, NP.  Per skype with Caitlin patient should have echocardiogram prior to appointment. Patient and her daughter aware of echocardiogram scheduled on 11-22-2018 at 11:15 and appointment with Urban Gibson at 1:15pm.  Patient agreed to plan and verbalized understanding. No further questions.

## 2018-11-19 NOTE — Telephone Encounter (Signed)
Patient reports having dizzy spells for about 1 month.  She states there is no particular cause that brings on the dizziness but afterwards "she is drained and no energy the rest of the day."    Patient reports seeing Amy Moon in early March for elevated blood pressures. Amy started her on Lisinopril and has increased her dose to Lisinopril 5mg  BID.  She states her BP has been running in the 130's for the last few weeks.   Patient is scheduled to be seen in follow up with Dr Bettina Gavia in Feb 2021, but feels she should be seen sooner with frequent dizzy spells as she feels it could be her MVP.  Please advise.

## 2018-11-19 NOTE — Telephone Encounter (Signed)
Is working with Laurann Montana, NP

## 2018-11-19 NOTE — Addendum Note (Signed)
Addended by: Stevan Born on: 11/19/2018 03:43 PM   Modules accepted: Orders
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
## 2018-11-22 ENCOUNTER — Ambulatory Visit (INDEPENDENT_AMBULATORY_CARE_PROVIDER_SITE_OTHER): Payer: Medicare Other | Admitting: Family

## 2018-11-22 ENCOUNTER — Ambulatory Visit (HOSPITAL_BASED_OUTPATIENT_CLINIC_OR_DEPARTMENT_OTHER)
Admission: RE | Admit: 2018-11-22 | Discharge: 2018-11-22 | Disposition: A | Discharge status: Home / Self Care | Payer: Medicare Other | Source: Ambulatory Visit | Attending: Cardiology | Admitting: Cardiology

## 2018-11-22 ENCOUNTER — Other Ambulatory Visit: Payer: Self-pay

## 2018-11-22 ENCOUNTER — Encounter: Payer: Self-pay | Admitting: Family

## 2018-11-22 VITALS — BP 146/64 | HR 97 | Ht 66.0 in | Wt 125.0 lb

## 2018-11-22 DIAGNOSIS — I340 Nonrheumatic mitral (valve) insufficiency: Secondary | ICD-10-CM | POA: Diagnosis not present

## 2018-11-22 DIAGNOSIS — E782 Mixed hyperlipidemia: Secondary | ICD-10-CM

## 2018-11-22 DIAGNOSIS — R002 Palpitations: Secondary | ICD-10-CM | POA: Diagnosis not present

## 2018-11-22 DIAGNOSIS — I341 Nonrheumatic mitral (valve) prolapse: Secondary | ICD-10-CM | POA: Insufficient documentation

## 2018-11-22 DIAGNOSIS — R42 Dizziness and giddiness: Secondary | ICD-10-CM

## 2018-11-22 NOTE — Patient Instructions (Signed)
Medication Instructions:  No medication changes today.   If you need a refill on your cardiac medications before your next appointment, please call your pharmacy.   Lab work: No lab work today.  Labs with Laverna Peace from July all looked good!  If you have labs (blood work) drawn today and your tests are completely normal, you will receive your results only by: Marland Kitchen MyChart Message (if you have MyChart) OR . A paper copy in the mail If you have any lab test that is abnormal or we need to change your treatment, we will call you to review the results.  Testing/Procedures: No testing today.   Follow-Up: At [facility], you and your health needs are our priority.  As part of our continuing mission to provide you with exceptional heart care, we have created designated Provider Care Teams.  These Care Teams include your primary Cardiologist (physician) and Advanced Practice Providers (APPs -  Physician Assistants and Nurse Practitioners) who all work together to provide you with the care you need, when you need it. You will need a follow up appointment in 6 months.  Please call our office 2 months in advance to schedule this appointment.  You may see Shirlee More, MD or another member of our [place] Provider Team in [place]: Jenne Campus, MD . Jyl Heinz, MD . Berniece Salines, MD  Any Other Special Instructions Will Be Listed Below (If Applicable).   Your echocardiogram today looked stable compared to the previous one in 2015. Your symptoms of dizziness do not appear to be related to your mitral valve.    It is important to remain adequately hydrated.    Be mindful of position changes. If we stand up too quickly this can cause something called 'orthostatic hypotension'. We have included additional information for you below.    Orthostatic Hypotension Blood pressure is a measurement of how strongly, or weakly, your blood is pressing against the walls of your arteries.  Orthostatic hypotension is a sudden drop in blood pressure that happens when you quickly change positions, such as when you get up from sitting or lying down. Arteries are blood vessels that carry blood from your heart throughout your body. When blood pressure is too low, you may not get enough blood to your brain or to the rest of your organs. This can cause weakness, light-headedness, rapid heartbeat, and fainting. This can last for just a few seconds or for up to a few minutes. Orthostatic hypotension is usually not a serious problem. However, if it happens frequently or gets worse, it may be a sign of something more serious. What are the causes? This condition may be caused by:  Sudden changes in posture, such as standing up quickly after you have been sitting or lying down.  Blood loss.  Loss of body fluids (dehydration).  Heart problems.  Hormone (endocrine) problems.  Pregnancy.  Severe infection.  Lack of certain nutrients.  Severe allergic reactions (anaphylaxis).  Certain medicines, such as blood pressure medicine or medicines that make the body lose excess fluids (diuretics). Sometimes, this condition can be caused by not taking medicine as directed, such as taking too much of a certain medicine. What increases the risk? The following factors may make you more likely to develop this condition:  Age. Risk increases as you get older.  Conditions that affect the heart or the central nervous system.  Taking certain medicines, such as blood pressure medicine or diuretics.  Being pregnant. What are the signs  or symptoms? Symptoms of this condition may include:  Weakness.  Light-headedness.  Dizziness.  Blurred vision.  Fatigue.  Rapid heartbeat.  Fainting, in severe cases. How is this diagnosed? This condition is diagnosed based on:  Your medical history.  Your symptoms.  Your blood pressure measurement. Your health care provider will check your blood  pressure when you are: ? Lying down. ? Sitting. ? Standing. A blood pressure reading is recorded as two numbers, such as "120 over 80" (or 120/80). The first ("top") number is called the systolic pressure. It is a measure of the pressure in your arteries as your heart beats. The second ("bottom") number is called the diastolic pressure. It is a measure of the pressure in your arteries when your heart relaxes between beats. Blood pressure is measured in a unit called mm Hg. Healthy blood pressure for most adults is 120/80. If your blood pressure is below 90/60, you may be diagnosed with hypotension. Other information or tests that may be used to diagnose orthostatic hypotension include:  Your other vital signs, such as your heart rate and temperature.  Blood tests.  Tilt table test. For this test, you will be safely secured to a table that moves you from a lying position to an upright position. Your heart rhythm and blood pressure will be monitored during the test. How is this treated? This condition may be treated by:  Changing your diet. This may involve eating more salt (sodium) or drinking more water.  Taking medicines to raise your blood pressure.  Changing the dosage of certain medicines you are taking that might be lowering your blood pressure.  Wearing compression stockings. These stockings help to prevent blood clots and reduce swelling in your legs. In some cases, you may need to go to the hospital for:  Fluid replacement. This means you will receive fluids through an IV.  Blood replacement. This means you will receive donated blood through an IV (transfusion).  Treating an infection or heart problems, if this applies.  Monitoring. You may need to be monitored while medicines that you are taking wear off. Follow these instructions at home: Eating and drinking   Drink enough fluid to keep your urine pale yellow.  Eat a healthy diet, and follow instructions from your  health care provider about eating or drinking restrictions. A healthy diet includes: ? Fresh fruits and vegetables. ? Whole grains. ? Lean meats. ? Low-fat dairy products.  Eat extra salt only as directed. Do not add extra salt to your diet unless your health care provider told you to do that.  Eat frequent, small meals.  Avoid standing up suddenly after eating. Medicines  Take over-the-counter and prescription medicines only as told by your health care provider. ? Follow instructions from your health care provider about changing the dosage of your current medicines, if this applies. ? Do not stop or adjust any of your medicines on your own. General instructions   Wear compression stockings as told by your health care provider.  Get up slowly from lying down or sitting positions. This gives your blood pressure a chance to adjust.  Avoid hot showers and excessive heat as directed by your health care provider.  Return to your normal activities as told by your health care provider. Ask your health care provider what activities are safe for you.  Do not use any products that contain nicotine or tobacco, such as cigarettes, e-cigarettes, and chewing tobacco. If you need help quitting, ask your health care  provider.  Keep all follow-up visits as told by your health care provider. This is important. Contact a health care provider if you:  Vomit.  Have diarrhea.  Have a fever for more than 2-3 days.  Feel more thirsty than usual.  Feel weak and tired. Get help right away if you:  Have chest pain.  Have a fast or irregular heartbeat.  Develop numbness in any part of your body.  Cannot move your arms or your legs.  Have trouble speaking.  Become sweaty or feel light-headed.  Faint.  Feel short of breath.  Have trouble staying awake.  Feel confused. Summary  Orthostatic hypotension is a sudden drop in blood pressure that happens when you quickly change positions.   Orthostatic hypotension is usually not a serious problem.  It is diagnosed by having your blood pressure taken lying down, sitting, and then standing.  It may be treated by changing your diet or adjusting your medicines. This information is not intended to replace advice given to you by your health care provider. Make sure you discuss any questions you have with your health care provider. Document Released: 02/24/2002 Document Revised: 08/30/2017 Document Reviewed: 08/30/2017 Elsevier Patient Education  2020 [employer].

## 2018-11-22 NOTE — Progress Notes (Signed)
  Echocardiogram 2D Echocardiogram has been performed.   Cheyenne Robinson 11/22/2018, 11:59 AM

## 2018-12-20 ENCOUNTER — Other Ambulatory Visit: Payer: Self-pay | Admitting: General Surgery

## 2018-12-20 DIAGNOSIS — C50412 Malignant neoplasm of upper-outer quadrant of left female breast: Secondary | ICD-10-CM

## 2018-12-20 DIAGNOSIS — Z170 Estrogen receptor positive status [ER+]: Secondary | ICD-10-CM

## 2019-01-02 DIAGNOSIS — D539 Nutritional anemia, unspecified: Secondary | ICD-10-CM | POA: Diagnosis not present

## 2019-01-02 DIAGNOSIS — E785 Hyperlipidemia, unspecified: Secondary | ICD-10-CM | POA: Diagnosis not present

## 2019-01-02 DIAGNOSIS — Z6821 Body mass index (BMI) 21.0-21.9, adult: Secondary | ICD-10-CM | POA: Diagnosis not present

## 2019-01-02 DIAGNOSIS — Z23 Encounter for immunization: Secondary | ICD-10-CM | POA: Diagnosis not present

## 2019-01-02 DIAGNOSIS — E039 Hypothyroidism, unspecified: Secondary | ICD-10-CM | POA: Diagnosis not present

## 2019-01-02 DIAGNOSIS — I10 Essential (primary) hypertension: Secondary | ICD-10-CM | POA: Diagnosis not present

## 2019-04-07 DIAGNOSIS — I10 Essential (primary) hypertension: Secondary | ICD-10-CM | POA: Diagnosis not present

## 2019-04-07 DIAGNOSIS — Z6821 Body mass index (BMI) 21.0-21.9, adult: Secondary | ICD-10-CM | POA: Diagnosis not present

## 2019-04-07 DIAGNOSIS — Z139 Encounter for screening, unspecified: Secondary | ICD-10-CM | POA: Diagnosis not present

## 2019-04-07 DIAGNOSIS — Z9181 History of falling: Secondary | ICD-10-CM | POA: Diagnosis not present

## 2019-04-07 DIAGNOSIS — E785 Hyperlipidemia, unspecified: Secondary | ICD-10-CM | POA: Diagnosis not present

## 2019-04-16 ENCOUNTER — Telehealth: Payer: Self-pay | Admitting: Cardiology

## 2019-04-16 NOTE — Telephone Encounter (Signed)
Patient's daughter states that her mother has trouble comprehending and remembering information. She would like to accompany her mother to her appointment on February 2nd. I will put a note in that it is okay for her to come to the appointment.

## 2019-04-16 NOTE — Telephone Encounter (Signed)
[name], patients daughter, called in because she states that she needs to come with her mother to her appt on 04/21/19 with Dr. Bettina Gavia. She states that her mother has a hard time retaining information and will not be able to remember enough to relay the information back to her after the appt.
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
## 2019-04-21 ENCOUNTER — Ambulatory Visit (INDEPENDENT_AMBULATORY_CARE_PROVIDER_SITE_OTHER): Payer: Medicare Other | Admitting: Cardiology

## 2019-04-21 ENCOUNTER — Other Ambulatory Visit: Payer: Self-pay

## 2019-04-21 ENCOUNTER — Encounter: Payer: Self-pay | Admitting: Cardiology

## 2019-04-21 VITALS — BP 162/84 | HR 118 | Ht 66.0 in | Wt 130.0 lb

## 2019-04-21 DIAGNOSIS — I341 Nonrheumatic mitral (valve) prolapse: Secondary | ICD-10-CM

## 2019-04-21 DIAGNOSIS — I471 Supraventricular tachycardia: Secondary | ICD-10-CM | POA: Diagnosis not present

## 2019-04-21 DIAGNOSIS — I480 Paroxysmal atrial fibrillation: Secondary | ICD-10-CM | POA: Diagnosis not present

## 2019-04-21 DIAGNOSIS — R002 Palpitations: Secondary | ICD-10-CM

## 2019-04-21 DIAGNOSIS — I10 Essential (primary) hypertension: Secondary | ICD-10-CM | POA: Diagnosis not present

## 2019-04-21 DIAGNOSIS — I340 Nonrheumatic mitral (valve) insufficiency: Secondary | ICD-10-CM | POA: Diagnosis not present

## 2019-04-21 DIAGNOSIS — E782 Mixed hyperlipidemia: Secondary | ICD-10-CM

## 2019-04-21 MED ORDER — METOPROLOL SUCCINATE ER 25 MG PO TB24: 25.0000 mg | ORAL_TABLET | Freq: Three times a day (TID) | ORAL | 3 refills | Status: DC

## 2019-04-21 MED ORDER — APIXABAN 5 MG PO TABS: 5.0000 mg | ORAL_TABLET | Freq: Two times a day (BID) | ORAL | 4 refills | Status: DC

## 2019-04-21 NOTE — Patient Instructions (Signed)
Medication Instructions:  Your physician has recommended you make the following change in your medication:  START taking Eliquis 5 mg (1 tablet) twice daily  STOP taking aspirin  START taking TOPROL 25 mg (1 tablet) 3 x's per day  *If you need a refill on your cardiac medications before your next appointment, please call your pharmacy*  Lab Work: NONE If you have labs (blood work) drawn today and your tests are completely normal, you will receive your results only by: Marland Kitchen MyChart Message (if you have MyChart) OR . A paper copy in the mail If you have any lab test that is abnormal or we need to change your treatment, we will call you to review the results.  Testing/Procedures: NONE  Follow-Up: At [facility], you and your health needs are our priority.  As part of our continuing mission to provide you with exceptional heart care, we have created designated Provider Care Teams.  These Care Teams include your primary Cardiologist (physician) and Advanced Practice Providers (APPs -  Physician Assistants and Nurse Practitioners) who all work together to provide you with the care you need, when you need it.  Your next appointment:   3 week(s)  The format for your next appointment:   In Person  Provider:   Shirlee More, MD  Other Instructions Apixaban oral tablets What is this medicine? APIXABAN (a PIX a ban) is an anticoagulant (blood thinner). It is used to lower the chance of stroke in people with a medical condition called atrial fibrillation. It is also used to treat or prevent blood clots in the lungs or in the veins. This medicine may be used for other purposes; ask your health care provider or pharmacist if you have questions. COMMON BRAND NAME(S): Eliquis What should I tell my health care provider before I take this medicine? They need to know if you have any of these conditions:  antiphospholipid antibody syndrome  bleeding disorders  bleeding in the brain  blood in  your stools (black or tarry stools) or if you have blood in your vomit  history of blood clots  history of stomach bleeding  kidney disease  liver disease  mechanical heart valve  an unusual or allergic reaction to apixaban, other medicines, foods, dyes, or preservatives  pregnant or trying to get pregnant  breast-feeding How should I use this medicine? Take this medicine by mouth with a glass of water. Follow the directions on the prescription label. You can take it with or without food. If it upsets your stomach, take it with food. Take your medicine at regular intervals. Do not take it more often than directed. Do not stop taking except on your doctor's advice. Stopping this medicine may increase your risk of a blood clot. Be sure to refill your prescription before you run out of medicine. Talk to your pediatrician regarding the use of this medicine in children. Special care may be needed. Overdosage: If you think you have taken too much of this medicine contact a poison control center or emergency room at once. NOTE: This medicine is only for you. Do not share this medicine with others. What if I miss a dose? If you miss a dose, take it as soon as you can. If it is almost time for your next dose, take only that dose. Do not take double or extra doses. What may interact with this medicine? This medicine may interact with the following:  aspirin and aspirin-like medicines  certain medicines for fungal infections like  ketoconazole and itraconazole  certain medicines for seizures like carbamazepine and phenytoin  certain medicines that treat or prevent blood clots like warfarin, enoxaparin, and dalteparin  clarithromycin  NSAIDs, medicines for pain and inflammation, like ibuprofen or naproxen  rifampin  ritonavir  St. John's wort This list may not describe all possible interactions. Give your health care provider a list of all the medicines, herbs, non-prescription drugs,  or dietary supplements you use. Also tell them if you smoke, drink alcohol, or use illegal drugs. Some items may interact with your medicine. What should I watch for while using this medicine? Visit your healthcare professional for regular checks on your progress. You may need blood work done while you are taking this medicine. Your condition will be monitored carefully while you are receiving this medicine. It is important not to miss any appointments. Avoid sports and activities that might cause injury while you are using this medicine. Severe falls or injuries can cause unseen bleeding. Be careful when using sharp tools or knives. Consider using an Copy. Take special care brushing or flossing your teeth. Report any injuries, bruising, or red spots on the skin to your healthcare professional. If you are going to need surgery or other procedure, tell your healthcare professional that you are taking this medicine. Wear a medical ID bracelet or chain. Carry a card that describes your disease and details of your medicine and dosage times. What side effects may I notice from receiving this medicine? Side effects that you should report to your doctor or health care professional as soon as possible:  allergic reactions like skin rash, itching or hives, swelling of the face, lips, or tongue  signs and symptoms of bleeding such as bloody or black, tarry stools; red or dark-brown urine; spitting up blood or brown material that looks like coffee grounds; red spots on the skin; unusual bruising or bleeding from the eye, gums, or nose  signs and symptoms of a blood clot such as chest pain; shortness of breath; pain, swelling, or warmth in the leg  signs and symptoms of a stroke such as changes in vision; confusion; trouble speaking or understanding; severe headaches; sudden numbness or weakness of the face, arm or leg; trouble walking; dizziness; loss of coordination This list may not describe all  possible side effects. Call your doctor for medical advice about side effects. You may report side effects to FDA at 1-800-FDA-1088. Where should I keep my medicine? Keep out of the reach of children. Store at room temperature between 20 and 25 degrees C (68 and 77 degrees F). Throw away any unused medicine after the expiration date. NOTE: This sheet is a summary. It may not cover all possible information. If you have questions about this medicine, talk to your doctor, pharmacist, or health care provider.  2020 Elsevier/Gold Standard (2017-11-14 17:39:34)
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
## 2019-05-15 ENCOUNTER — Ambulatory Visit (INDEPENDENT_AMBULATORY_CARE_PROVIDER_SITE_OTHER): Payer: Medicare Other | Admitting: Cardiology

## 2019-05-15 ENCOUNTER — Encounter: Payer: Self-pay | Admitting: Cardiology

## 2019-05-15 ENCOUNTER — Other Ambulatory Visit: Payer: Self-pay

## 2019-05-15 VITALS — BP 160/82 | HR 131 | Temp 96.9°F | Ht 66.0 in | Wt 133.0 lb

## 2019-05-15 DIAGNOSIS — I340 Nonrheumatic mitral (valve) insufficiency: Secondary | ICD-10-CM

## 2019-05-15 DIAGNOSIS — I10 Essential (primary) hypertension: Secondary | ICD-10-CM | POA: Diagnosis not present

## 2019-05-15 DIAGNOSIS — Z7901 Long term (current) use of anticoagulants: Secondary | ICD-10-CM

## 2019-05-15 DIAGNOSIS — I4819 Other persistent atrial fibrillation: Secondary | ICD-10-CM | POA: Diagnosis not present

## 2019-05-15 MED ORDER — METOPROLOL SUCCINATE ER 25 MG PO TB24: ORAL_TABLET | ORAL | 3 refills | Status: AC

## 2019-05-15 MED ORDER — METOPROLOL SUCCINATE ER 25 MG PO TB24: ORAL_TABLET | ORAL | 3 refills | Status: DC

## 2019-05-15 NOTE — Patient Instructions (Signed)
Medication Instructions:  Your physician has recommended you make the following change in your medication:  1.  We have sent in a new rx for Toprol with directions of taking 2 tablets in the a.m. and 1 tablet in the p.m  *If you need a refill on your cardiac medications before your next appointment, please call your pharmacy*  Lab Work: None ordered If you have labs (blood work) drawn today and your tests are completely normal, you will receive your results only by: Marland Kitchen MyChart Message (if you have MyChart) OR . A paper copy in the mail If you have any lab test that is abnormal or we need to change your treatment, we will call you to review the results.  Testing/Procedures: None ordered  Follow-Up: At [facility], you and your health needs are our priority.  As part of our continuing mission to provide you with exceptional heart care, we have created designated Provider Care Teams.  These Care Teams include your primary Cardiologist (physician) and Advanced Practice Providers (APPs -  Physician Assistants and Nurse Practitioners) who all work together to provide you with the care you need, when you need it.  Your next appointment:   6 month(s)  The format for your next appointment:   In Person  Provider:   Shirlee More, MD  Other Instructions

## 2019-05-23 DIAGNOSIS — Z6822 Body mass index (BMI) 22.0-22.9, adult: Secondary | ICD-10-CM | POA: Diagnosis not present

## 2019-05-23 DIAGNOSIS — R531 Weakness: Secondary | ICD-10-CM | POA: Diagnosis not present

## 2019-05-23 DIAGNOSIS — I10 Essential (primary) hypertension: Secondary | ICD-10-CM | POA: Diagnosis not present

## 2019-05-23 DIAGNOSIS — E039 Hypothyroidism, unspecified: Secondary | ICD-10-CM | POA: Diagnosis not present

## 2019-05-23 DIAGNOSIS — I471 Supraventricular tachycardia: Secondary | ICD-10-CM | POA: Diagnosis not present

## 2019-05-26 ENCOUNTER — Encounter: Payer: Self-pay | Admitting: Cardiology

## 2019-05-30 DIAGNOSIS — I471 Supraventricular tachycardia: Secondary | ICD-10-CM | POA: Diagnosis not present

## 2019-05-30 DIAGNOSIS — I10 Essential (primary) hypertension: Secondary | ICD-10-CM | POA: Diagnosis not present

## 2019-05-30 DIAGNOSIS — E039 Hypothyroidism, unspecified: Secondary | ICD-10-CM | POA: Diagnosis not present

## 2019-05-30 DIAGNOSIS — Z6823 Body mass index (BMI) 23.0-23.9, adult: Secondary | ICD-10-CM | POA: Diagnosis not present

## 2019-06-19 DIAGNOSIS — I10 Essential (primary) hypertension: Secondary | ICD-10-CM | POA: Diagnosis not present

## 2019-06-19 DIAGNOSIS — N289 Disorder of kidney and ureter, unspecified: Secondary | ICD-10-CM | POA: Diagnosis not present

## 2019-06-19 DIAGNOSIS — I471 Supraventricular tachycardia: Secondary | ICD-10-CM | POA: Diagnosis not present

## 2019-06-19 DIAGNOSIS — Z6822 Body mass index (BMI) 22.0-22.9, adult: Secondary | ICD-10-CM | POA: Diagnosis not present

## 2019-07-21 DIAGNOSIS — Z6821 Body mass index (BMI) 21.0-21.9, adult: Secondary | ICD-10-CM | POA: Diagnosis not present

## 2019-07-21 DIAGNOSIS — N289 Disorder of kidney and ureter, unspecified: Secondary | ICD-10-CM | POA: Diagnosis not present

## 2019-07-21 DIAGNOSIS — I471 Supraventricular tachycardia: Secondary | ICD-10-CM | POA: Diagnosis not present

## 2019-07-21 DIAGNOSIS — I10 Essential (primary) hypertension: Secondary | ICD-10-CM | POA: Diagnosis not present

## 2019-08-07 DIAGNOSIS — I482 Chronic atrial fibrillation, unspecified: Secondary | ICD-10-CM | POA: Diagnosis not present

## 2019-08-07 DIAGNOSIS — R531 Weakness: Secondary | ICD-10-CM | POA: Diagnosis not present

## 2019-08-07 DIAGNOSIS — I10 Essential (primary) hypertension: Secondary | ICD-10-CM | POA: Diagnosis not present

## 2019-08-07 DIAGNOSIS — E785 Hyperlipidemia, unspecified: Secondary | ICD-10-CM | POA: Diagnosis not present

## 2019-08-07 DIAGNOSIS — Z6821 Body mass index (BMI) 21.0-21.9, adult: Secondary | ICD-10-CM | POA: Diagnosis not present

## 2019-09-11 ENCOUNTER — Telehealth: Payer: Self-pay | Admitting: Cardiology

## 2019-09-11 NOTE — Telephone Encounter (Signed)
New message   Patient's daughter will be coming with patient to Dr. Bettina Gavia appt because the patient has cognitive issues.
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
## 2019-09-16 ENCOUNTER — Other Ambulatory Visit: Payer: Self-pay

## 2019-09-16 ENCOUNTER — Ambulatory Visit: Payer: Medicare Other | Admitting: Cardiology

## 2019-09-16 ENCOUNTER — Encounter: Payer: Self-pay | Admitting: Cardiology

## 2019-09-16 VITALS — BP 138/62 | HR 108 | Ht 66.0 in | Wt 128.0 lb

## 2019-09-16 DIAGNOSIS — I4819 Other persistent atrial fibrillation: Secondary | ICD-10-CM | POA: Diagnosis not present

## 2019-09-16 DIAGNOSIS — I110 Hypertensive heart disease with heart failure: Secondary | ICD-10-CM

## 2019-09-16 DIAGNOSIS — Z7901 Long term (current) use of anticoagulants: Secondary | ICD-10-CM | POA: Diagnosis not present

## 2019-09-16 DIAGNOSIS — I5032 Chronic diastolic (congestive) heart failure: Secondary | ICD-10-CM | POA: Diagnosis not present

## 2019-09-16 DIAGNOSIS — I340 Nonrheumatic mitral (valve) insufficiency: Secondary | ICD-10-CM | POA: Diagnosis not present

## 2019-09-16 MED ORDER — APIXABAN 2.5 MG PO TABS: 2.5000 mg | ORAL_TABLET | Freq: Two times a day (BID) | ORAL | 3 refills | Status: AC

## 2019-09-16 NOTE — Patient Instructions (Signed)
Medication Instructions:  Your physician has recommended you make the following change in your medication:  CHANGE: Furosemide 20 mg take one tablet by mouth daily.  DECREASE: Eliquis 2.5 mg take one tablet by mouth daily.  *If you need a refill on your cardiac medications before your next appointment, please call your pharmacy*   Lab Work: None If you have labs (blood work) drawn today and your tests are completely normal, you will receive your results only by: Marland Kitchen MyChart Message (if you have MyChart) OR . A paper copy in the mail If you have any lab test that is abnormal or we need to change your treatment, we will call you to review the results.   Testing/Procedures: Your physician has requested that you have an echocardiogram. Echocardiography is a painless test that uses sound waves to create images of your heart. It provides your doctor with information about the size and shape of your heart and how well your heart's chambers and valves are working. This procedure takes approximately one hour. There are no restrictions for this procedure.     Follow-Up: At [facility], you and your health needs are our priority.  As part of our continuing mission to provide you with exceptional heart care, we have created designated Provider Care Teams.  These Care Teams include your primary Cardiologist (physician) and Advanced Practice Providers (APPs -  Physician Assistants and Nurse Practitioners) who all work together to provide you with the care you need, when you need it.  We recommend signing up for the patient portal called "MyChart".  Sign up information is provided on this After Visit Summary.  MyChart is used to connect with patients for Virtual Visits (Telemedicine).  Patients are able to view lab/test results, encounter notes, upcoming appointments, etc.  Non-urgent messages can be sent to your provider as well.   To learn more about what you can do with MyChart, go to  [email or web address].    Your next appointment:   3 month(s)  The format for your next appointment:   In Person  Provider:   Shirlee More, MD   Other Instructions

## 2019-09-16 NOTE — Addendum Note (Signed)
Addended by: Resa Miner I on: 09/16/2019 04:31 PM   Modules accepted: Orders

## 2019-10-01 ENCOUNTER — Ambulatory Visit (INDEPENDENT_AMBULATORY_CARE_PROVIDER_SITE_OTHER): Payer: Medicare Other

## 2019-10-01 ENCOUNTER — Telehealth: Payer: Self-pay

## 2019-10-01 ENCOUNTER — Other Ambulatory Visit: Payer: Self-pay

## 2019-10-01 DIAGNOSIS — I340 Nonrheumatic mitral (valve) insufficiency: Secondary | ICD-10-CM

## 2019-10-01 NOTE — Telephone Encounter (Signed)
-----   Message from Richardo Priest, MD sent at 10/01/2019  2:02 PM EDT ----- Normal or stable result  Good result we can discuss at office follow-up

## 2019-10-01 NOTE — Telephone Encounter (Signed)
Spoke with patients daughter regarding results and recommendation.  She verbalizes understanding and is agreeable to plan of care. Advised patient to call back with any issues or concerns.  

## 2019-10-01 NOTE — Progress Notes (Signed)
Complete echocardiogram performed.  Jimmy Zailah Zagami RDCS, RVT  

## 2019-10-01 NOTE — Telephone Encounter (Signed)
Cheyenne Robinson is returning [employer].

## 2019-10-01 NOTE — Telephone Encounter (Signed)
Left message on patients voicemail to please return our call.   

## 2019-10-02 DIAGNOSIS — Z6821 Body mass index (BMI) 21.0-21.9, adult: Secondary | ICD-10-CM | POA: Diagnosis not present

## 2019-10-02 DIAGNOSIS — I10 Essential (primary) hypertension: Secondary | ICD-10-CM | POA: Diagnosis not present

## 2019-10-02 DIAGNOSIS — R531 Weakness: Secondary | ICD-10-CM | POA: Diagnosis not present

## 2019-10-02 DIAGNOSIS — I482 Chronic atrial fibrillation, unspecified: Secondary | ICD-10-CM | POA: Diagnosis not present

## 2019-11-11 ENCOUNTER — Ambulatory Visit: Payer: Medicare Other | Admitting: Cardiology

## 2019-12-11 DIAGNOSIS — R531 Weakness: Secondary | ICD-10-CM | POA: Diagnosis not present

## 2019-12-11 DIAGNOSIS — E785 Hyperlipidemia, unspecified: Secondary | ICD-10-CM | POA: Diagnosis not present

## 2019-12-11 DIAGNOSIS — I482 Chronic atrial fibrillation, unspecified: Secondary | ICD-10-CM | POA: Diagnosis not present

## 2019-12-11 DIAGNOSIS — I10 Essential (primary) hypertension: Secondary | ICD-10-CM | POA: Diagnosis not present

## 2019-12-11 DIAGNOSIS — D539 Nutritional anemia, unspecified: Secondary | ICD-10-CM | POA: Diagnosis not present

## 2019-12-11 DIAGNOSIS — E559 Vitamin D deficiency, unspecified: Secondary | ICD-10-CM | POA: Diagnosis not present

## 2019-12-11 DIAGNOSIS — E039 Hypothyroidism, unspecified: Secondary | ICD-10-CM | POA: Diagnosis not present
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
## 2019-12-19 ENCOUNTER — Ambulatory Visit (INDEPENDENT_AMBULATORY_CARE_PROVIDER_SITE_OTHER): Payer: Medicare Other | Admitting: Cardiology

## 2019-12-19 ENCOUNTER — Encounter: Payer: Self-pay | Admitting: Cardiology

## 2019-12-19 ENCOUNTER — Other Ambulatory Visit: Payer: Self-pay

## 2019-12-19 VITALS — BP 130/75 | HR 96 | Ht 66.0 in | Wt 129.0 lb

## 2019-12-19 DIAGNOSIS — I5032 Chronic diastolic (congestive) heart failure: Secondary | ICD-10-CM | POA: Diagnosis not present

## 2019-12-19 DIAGNOSIS — I4819 Other persistent atrial fibrillation: Secondary | ICD-10-CM

## 2019-12-19 DIAGNOSIS — Z7901 Long term (current) use of anticoagulants: Secondary | ICD-10-CM | POA: Diagnosis not present

## 2019-12-19 DIAGNOSIS — I110 Hypertensive heart disease with heart failure: Secondary | ICD-10-CM

## 2019-12-19 DIAGNOSIS — I340 Nonrheumatic mitral (valve) insufficiency: Secondary | ICD-10-CM

## 2019-12-19 NOTE — Patient Instructions (Signed)

## 2020-01-29 DIAGNOSIS — K625 Hemorrhage of anus and rectum: Secondary | ICD-10-CM | POA: Diagnosis not present

## 2020-01-29 DIAGNOSIS — Z6821 Body mass index (BMI) 21.0-21.9, adult: Secondary | ICD-10-CM | POA: Diagnosis not present

## 2020-02-09 DIAGNOSIS — Z882 Allergy status to sulfonamides status: Secondary | ICD-10-CM | POA: Diagnosis not present

## 2020-02-09 DIAGNOSIS — K921 Melena: Secondary | ICD-10-CM | POA: Diagnosis not present

## 2020-02-09 DIAGNOSIS — Z79899 Other long term (current) drug therapy: Secondary | ICD-10-CM | POA: Diagnosis not present

## 2020-02-09 DIAGNOSIS — R21 Rash and other nonspecific skin eruption: Secondary | ICD-10-CM | POA: Diagnosis not present

## 2020-02-09 DIAGNOSIS — Z743 Need for continuous supervision: Secondary | ICD-10-CM | POA: Diagnosis not present

## 2020-02-09 DIAGNOSIS — I4891 Unspecified atrial fibrillation: Secondary | ICD-10-CM | POA: Diagnosis not present

## 2020-02-09 DIAGNOSIS — E785 Hyperlipidemia, unspecified: Secondary | ICD-10-CM | POA: Diagnosis not present

## 2020-02-09 DIAGNOSIS — R059 Cough, unspecified: Secondary | ICD-10-CM | POA: Diagnosis not present

## 2020-02-09 DIAGNOSIS — J9811 Atelectasis: Secondary | ICD-10-CM | POA: Diagnosis not present

## 2020-02-09 DIAGNOSIS — R0902 Hypoxemia: Secondary | ICD-10-CM | POA: Diagnosis not present

## 2020-02-09 DIAGNOSIS — Z515 Encounter for palliative care: Secondary | ICD-10-CM | POA: Diagnosis not present

## 2020-02-09 DIAGNOSIS — F32A Depression, unspecified: Secondary | ICD-10-CM | POA: Diagnosis not present

## 2020-02-09 DIAGNOSIS — J9690 Respiratory failure, unspecified, unspecified whether with hypoxia or hypercapnia: Secondary | ICD-10-CM | POA: Diagnosis not present

## 2020-02-09 DIAGNOSIS — Z888 Allergy status to other drugs, medicaments and biological substances status: Secondary | ICD-10-CM | POA: Diagnosis not present

## 2020-02-09 DIAGNOSIS — R000 Tachycardia, unspecified: Secondary | ICD-10-CM | POA: Diagnosis not present

## 2020-02-09 DIAGNOSIS — J9601 Acute respiratory failure with hypoxia: Secondary | ICD-10-CM | POA: Diagnosis not present

## 2020-02-09 DIAGNOSIS — E871 Hypo-osmolality and hyponatremia: Secondary | ICD-10-CM | POA: Diagnosis not present

## 2020-02-09 DIAGNOSIS — A419 Sepsis, unspecified organism: Secondary | ICD-10-CM | POA: Diagnosis not present

## 2020-02-09 DIAGNOSIS — I10 Essential (primary) hypertension: Secondary | ICD-10-CM | POA: Diagnosis not present

## 2020-02-09 DIAGNOSIS — Z66 Do not resuscitate: Secondary | ICD-10-CM | POA: Diagnosis not present

## 2020-02-09 DIAGNOSIS — J90 Pleural effusion, not elsewhere classified: Secondary | ICD-10-CM | POA: Diagnosis not present

## 2020-02-09 DIAGNOSIS — R509 Fever, unspecified: Secondary | ICD-10-CM | POA: Diagnosis not present

## 2020-02-09 DIAGNOSIS — I499 Cardiac arrhythmia, unspecified: Secondary | ICD-10-CM | POA: Diagnosis not present

## 2020-02-09 DIAGNOSIS — E860 Dehydration: Secondary | ICD-10-CM | POA: Diagnosis not present

## 2020-02-09 DIAGNOSIS — E7800 Pure hypercholesterolemia, unspecified: Secondary | ICD-10-CM | POA: Diagnosis not present

## 2020-02-09 DIAGNOSIS — R404 Transient alteration of awareness: Secondary | ICD-10-CM | POA: Diagnosis not present

## 2020-02-09 DIAGNOSIS — E878 Other disorders of electrolyte and fluid balance, not elsewhere classified: Secondary | ICD-10-CM | POA: Diagnosis not present

## 2020-02-10 DIAGNOSIS — E871 Hypo-osmolality and hyponatremia: Secondary | ICD-10-CM | POA: Diagnosis not present

## 2020-02-10 DIAGNOSIS — R0902 Hypoxemia: Secondary | ICD-10-CM | POA: Diagnosis not present

## 2020-02-10 DIAGNOSIS — J9601 Acute respiratory failure with hypoxia: Secondary | ICD-10-CM | POA: Diagnosis not present

## 2020-02-11 DIAGNOSIS — J9601 Acute respiratory failure with hypoxia: Secondary | ICD-10-CM | POA: Diagnosis not present

## 2020-02-11 DIAGNOSIS — E871 Hypo-osmolality and hyponatremia: Secondary | ICD-10-CM | POA: Diagnosis not present

## 2020-02-11 DIAGNOSIS — J9690 Respiratory failure, unspecified, unspecified whether with hypoxia or hypercapnia: Secondary | ICD-10-CM | POA: Diagnosis not present

## 2020-02-11 DIAGNOSIS — J9811 Atelectasis: Secondary | ICD-10-CM | POA: Diagnosis not present

## 2020-02-11 DIAGNOSIS — R21 Rash and other nonspecific skin eruption: Secondary | ICD-10-CM | POA: Diagnosis not present

## 2020-02-11 DIAGNOSIS — J90 Pleural effusion, not elsewhere classified: Secondary | ICD-10-CM | POA: Diagnosis not present

## 2020-02-12 DIAGNOSIS — R404 Transient alteration of awareness: Secondary | ICD-10-CM | POA: Diagnosis not present

## 2020-02-12 DIAGNOSIS — E871 Hypo-osmolality and hyponatremia: Secondary | ICD-10-CM | POA: Diagnosis not present

## 2020-02-12 DIAGNOSIS — J9601 Acute respiratory failure with hypoxia: Secondary | ICD-10-CM | POA: Diagnosis not present

## 2020-02-12 DIAGNOSIS — Z743 Need for continuous supervision: Secondary | ICD-10-CM | POA: Diagnosis not present

## 2020-02-12 DIAGNOSIS — J9690 Respiratory failure, unspecified, unspecified whether with hypoxia or hypercapnia: Secondary | ICD-10-CM | POA: Diagnosis not present

## 2020-02-18 DEATH — deceased
